# Patient Record
Sex: Female | Born: 1985 | Hispanic: Yes | Marital: Married | Smoking: Never smoker
Health system: Southern US, Community
[De-identification: ages and names within clinical notes are randomized; demographics above are authoritative.]

---

## 2019-07-17 ENCOUNTER — Emergency Department: Payer: Worker's Compensation

## 2019-07-17 ENCOUNTER — Emergency Department
Admission: EM | Admit: 2019-07-17 | Discharge: 2019-07-17 | Disposition: A | Discharge status: Home / Self Care | Payer: Worker's Compensation | Attending: Emergency Medicine | Admitting: Emergency Medicine

## 2019-07-17 ENCOUNTER — Other Ambulatory Visit: Payer: Self-pay

## 2019-07-17 ENCOUNTER — Encounter: Payer: Self-pay | Admitting: Emergency Medicine

## 2019-07-17 DIAGNOSIS — Y990 Civilian activity done for income or pay: Secondary | ICD-10-CM | POA: Insufficient documentation

## 2019-07-17 DIAGNOSIS — S300XXA Contusion of lower back and pelvis, initial encounter: Secondary | ICD-10-CM | POA: Insufficient documentation

## 2019-07-17 DIAGNOSIS — W010XXA Fall on same level from slipping, tripping and stumbling without subsequent striking against object, initial encounter: Secondary | ICD-10-CM | POA: Insufficient documentation

## 2019-07-17 DIAGNOSIS — Y939 Activity, unspecified: Secondary | ICD-10-CM | POA: Insufficient documentation

## 2019-07-17 DIAGNOSIS — Y929 Unspecified place or not applicable: Secondary | ICD-10-CM | POA: Insufficient documentation

## 2019-07-17 DIAGNOSIS — S3992XA Unspecified injury of lower back, initial encounter: Secondary | ICD-10-CM | POA: Diagnosis present

## 2019-07-17 LAB — POC URINE PREG, ED: Preg Test, Ur: NEGATIVE

## 2019-07-17 MED ORDER — TRAMADOL HCL 50 MG PO TABS
50.0000 mg | ORAL_TABLET | Freq: Once | ORAL | Status: AC
  Administered 2019-07-17: 50 mg via ORAL
  Filled 2019-07-17: qty 1

## 2019-07-17 MED ORDER — CYCLOBENZAPRINE HCL 10 MG PO TABS: 10.0000 mg | ORAL_TABLET | Freq: Three times a day (TID) | ORAL | 0 refills | Status: DC | PRN

## 2019-07-17 MED ORDER — TRAMADOL HCL 50 MG PO TABS: 50.0000 mg | ORAL_TABLET | Freq: Four times a day (QID) | ORAL | 0 refills | Status: DC | PRN

## 2019-07-17 NOTE — ED Notes (Signed)
See triage note. Pt c/o of lower back pain radiating to left hip and leg.

## 2019-07-17 NOTE — ED Provider Notes (Signed)
[facility] Emergency Department Provider Note ____________________________________________  Time seen: Approximately 11:59 PM  I have reviewed the triage vital signs and the nursing notes.   HISTORY  Chief Complaint Back Pain and Fall    HPI [place] Karlyn Glasco is a 34 y.o. female who presents to the emergency department for evaluation and treatment of sacral pain after mechanical, nonsyncopal fall at work yesterday.  She slipped in something and landed on her lower back.  She was evaluated at urgent care and given methocarbamol.  She has not had any relief.  No x-rays were taken.  History reviewed. No pertinent past medical history.  There are no problems to display for this patient.   History reviewed. No pertinent surgical history.  Prior to Admission medications   Medication Sig Start Date End Date Taking? Authorizing Provider  cyclobenzaprine (FLEXERIL) 10 MG tablet Take 1 tablet (10 mg total) by mouth 3 (three) times daily as needed. 07/17/19   Willetta York, Rulon Eisenmenger B, FNP  traMADol (ULTRAM) 50 MG tablet Take 1 tablet (50 mg total) by mouth every 6 (six) hours as needed. 07/17/19   Chinita Pester, FNP    Allergies Patient has no allergy information on record.  No family history on file.  Social History Social History   Tobacco Use  . Smoking status: Not on file  Substance Use Topics  . Alcohol use: Not on file  . Drug use: Not on file    Review of Systems Constitutional: Negative for fever. Cardiovascular: Negative for chest pain. Respiratory: Negative for shortness of breath. Musculoskeletal: Positive for lower back pain. Skin: Negative for open wounds or lesions. Neurological: Negative for decrease in sensation  ____________________________________________   PHYSICAL EXAM:  VITAL SIGNS: ED Triage Vitals  Enc Vitals Group     BP 07/17/19 1807 (!) 108/92     Pulse Rate 07/17/19 1807 88     Resp 07/17/19 1807 18     Temp  07/17/19 1807 99.7 F (37.6 C)     Temp Source 07/17/19 1807 Oral     SpO2 07/17/19 1807 99 %     Weight 07/17/19 1801 143 lb (64.9 kg)     Height 07/17/19 1801 5\' 2"  (1.575 m)     Head Circumference --      Peak Flow --      Pain Score 07/17/19 1801 7     Pain Loc --      Pain Edu? --      Excl. in GC? --     Constitutional: Alert and oriented. Well appearing and in no acute distress. Eyes: Conjunctivae are clear without discharge or drainage Head: Atraumatic Neck: Supple.  No focal midline tenderness. Respiratory: No cough. Respirations are even and unlabored. Musculoskeletal: Focal tenderness over the sacrum.  No midline tenderness over the lumbar spine. Neurologic: Motor and sensory function is intact.  No radiculopathy. Skin: Intact Psychiatric: Affect and behavior are appropriate.  ____________________________________________   LABS (all labs ordered are listed, but only abnormal results are displayed)  Labs Reviewed  POC URINE PREG, ED   ____________________________________________  RADIOLOGY  Image of the sacrum and coccyx is negative for acute bony abnormality per radiology. ____________________________________________   PROCEDURES  Procedures  ____________________________________________   INITIAL IMPRESSION / ASSESSMENT AND PLAN / ED COURSE  Zarielle Cea Abbie Berling is a 34 y.o. who presents to the emergency department for evaluation after mechanical, nonsyncopal fall while at work.  See HPI for further details.  X-ray  of the coccyx and sacrum is negative for acute findings.  The patient will be treated with Flexeril and tramadol.  She is to rest over the next couple of days and use ice off and on.  She will be provided a work excuse for the next couple days as well.  Patient instructed to follow-up with orthopedics if not improving over the week.  She was also instructed to return to the emergency department for symptoms that change or worsen if  unable schedule an appointment with orthopedics or primary care.  Medications  traMADol (ULTRAM) tablet 50 mg (50 mg Oral Given 07/17/19 2149)    Pertinent labs & imaging results that were available during my care of the patient were reviewed by me and considered in my medical decision making (see chart for details).  _________________________________________   FINAL CLINICAL IMPRESSION(S) / ED DIAGNOSES  Final diagnoses:  Sacral contusion, initial encounter    ED Discharge Orders         Ordered    cyclobenzaprine (FLEXERIL) 10 MG tablet  3 times daily PRN     07/17/19 2132    traMADol (ULTRAM) 50 MG tablet  Every 6 hours PRN     07/17/19 2132           If controlled substance prescribed during this visit, 12 month history viewed on the [place] prior to issuing an initial prescription for Schedule II or III opiod.   Victorino Dike, FNP 07/18/19 0001    Vanessa [place], MD 07/18/19 1504

## 2019-07-17 NOTE — ED Triage Notes (Signed)
Per interpreter, pt fell at work and hurt her lower back. 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

## 2020-12-08 ENCOUNTER — Emergency Department
Admission: EM | Admit: 2020-12-08 | Discharge: 2020-12-08 | Disposition: A | Discharge status: Home / Self Care | Payer: Self-pay | Attending: Emergency Medicine | Admitting: Emergency Medicine

## 2020-12-08 ENCOUNTER — Emergency Department: Payer: Self-pay

## 2020-12-08 ENCOUNTER — Other Ambulatory Visit: Payer: Self-pay

## 2020-12-08 DIAGNOSIS — D72829 Elevated white blood cell count, unspecified: Secondary | ICD-10-CM | POA: Insufficient documentation

## 2020-12-08 DIAGNOSIS — R112 Nausea with vomiting, unspecified: Secondary | ICD-10-CM | POA: Insufficient documentation

## 2020-12-08 DIAGNOSIS — R1084 Generalized abdominal pain: Secondary | ICD-10-CM | POA: Insufficient documentation

## 2020-12-08 DIAGNOSIS — E860 Dehydration: Secondary | ICD-10-CM | POA: Insufficient documentation

## 2020-12-08 DIAGNOSIS — R197 Diarrhea, unspecified: Secondary | ICD-10-CM | POA: Insufficient documentation

## 2020-12-08 DIAGNOSIS — Z20822 Contact with and (suspected) exposure to covid-19: Secondary | ICD-10-CM | POA: Insufficient documentation

## 2020-12-08 LAB — URINALYSIS, COMPLETE (UACMP) WITH MICROSCOPIC
Bilirubin Urine: NEGATIVE
Glucose, UA: NEGATIVE mg/dL
Hgb urine dipstick: NEGATIVE
Ketones, ur: NEGATIVE mg/dL
Leukocytes,Ua: NEGATIVE
Nitrite: NEGATIVE
Protein, ur: 100 mg/dL — AB
Specific Gravity, Urine: 1.03 (ref 1.005–1.030)
pH: 6 (ref 5.0–8.0)

## 2020-12-08 LAB — COMPREHENSIVE METABOLIC PANEL
ALT: 26 U/L (ref 0–44)
AST: 19 U/L (ref 15–41)
Albumin: 4.4 g/dL (ref 3.5–5.0)
Alkaline Phosphatase: 65 U/L (ref 38–126)
Anion gap: 12 (ref 5–15)
BUN: 19 mg/dL (ref 6–20)
CO2: 21 mmol/L — ABNORMAL LOW (ref 22–32)
Calcium: 9 mg/dL (ref 8.9–10.3)
Chloride: 101 mmol/L (ref 98–111)
Creatinine, Ser: 0.64 mg/dL (ref 0.44–1.00)
GFR, Estimated: >60 mL/min (ref >60)
Glucose, Bld: 106 mg/dL — ABNORMAL HIGH (ref 70–99)
Potassium: 3.3 mmol/L — ABNORMAL LOW (ref 3.5–5.1)
Sodium: 134 mmol/L — ABNORMAL LOW (ref 135–145)
Total Bilirubin: 1.2 mg/dL (ref 0.3–1.2)
Total Protein: 8.4 g/dL — ABNORMAL HIGH (ref 6.5–8.1)

## 2020-12-08 LAB — CBC
HCT: 49.7 % — ABNORMAL HIGH (ref 36.0–46.0)
Hemoglobin: 17.3 g/dL — ABNORMAL HIGH (ref 12.0–15.0)
MCH: 27.9 pg (ref 26.0–34.0)
MCHC: 34.8 g/dL (ref 30.0–36.0)
MCV: 80 fL (ref 80.0–100.0)
Platelets: 246 10*3/uL (ref 150–400)
RBC: 6.21 MIL/uL — ABNORMAL HIGH (ref 3.87–5.11)
RDW: 13.2 % (ref 11.5–15.5)
WBC: 11.6 10*3/uL — ABNORMAL HIGH (ref 4.0–10.5)
nRBC: 0 % (ref 0.0–0.2)

## 2020-12-08 LAB — LIPASE, BLOOD: Lipase: 34 U/L (ref 11–51)

## 2020-12-08 LAB — SARS CORONAVIRUS 2 (TAT 6-24 HRS): SARS Coronavirus 2: NEGATIVE

## 2020-12-08 LAB — POC URINE PREG, ED: Preg Test, Ur: NEGATIVE

## 2020-12-08 MED ORDER — ONDANSETRON HCL 4 MG/2ML IJ SOLN
4.0000 mg | Freq: Once | INTRAMUSCULAR | Status: AC
  Administered 2020-12-08: 4 mg via INTRAVENOUS
  Filled 2020-12-08: qty 2

## 2020-12-08 MED ORDER — KETOROLAC TROMETHAMINE 30 MG/ML IJ SOLN
15.0000 mg | Freq: Once | INTRAMUSCULAR | Status: AC
  Administered 2020-12-08: 15 mg via INTRAVENOUS
  Filled 2020-12-08: qty 1

## 2020-12-08 MED ORDER — ONDANSETRON 4 MG PO TBDP: 4.0000 mg | ORAL_TABLET | Freq: Three times a day (TID) | ORAL | 0 refills | Status: DC | PRN

## 2020-12-08 MED ORDER — IBUPROFEN 600 MG PO TABS: 600.0000 mg | ORAL_TABLET | Freq: Three times a day (TID) | ORAL | 0 refills | Status: DC | PRN

## 2020-12-08 MED ORDER — SODIUM CHLORIDE 0.9 % IV BOLUS
1000.0000 mL | Freq: Once | INTRAVENOUS | Status: AC
  Administered 2020-12-08: 1000 mL via INTRAVENOUS

## 2020-12-08 MED ORDER — MORPHINE SULFATE (PF) 4 MG/ML IV SOLN
4.0000 mg | Freq: Once | INTRAVENOUS | Status: AC
  Administered 2020-12-08: 4 mg via INTRAVENOUS
  Filled 2020-12-08: qty 1

## 2020-12-08 NOTE — ED Notes (Signed)
Pt transported to CT at this time.

## 2020-12-08 NOTE — ED Triage Notes (Signed)
Pt c/o abd pain with N/VD since yesterday. 

## 2020-12-08 NOTE — ED Notes (Signed)
Pt was able to keep down glass of water with no vomiting.

## 2020-12-08 NOTE — ED Notes (Addendum)
MD and interpreter at bedside to discuss results with pt and plan for discharge. MD will order COVID test and pt will be notified via phone if it is positive and he will prescribe her some medication for her abdomen complaints.

## 2020-12-08 NOTE — ED Triage Notes (Signed)
First Nurse Note:  Arrives from [facility] for ED evaluation of abdominal pain and diarrhea.  AAOx3.  MAD.  Skin warm and dry.

## 2020-12-08 NOTE — ED Notes (Signed)
Pt given water for PO challenge 

## 2020-12-08 NOTE — ED Provider Notes (Signed)
[facility] Emergency Department Provider Note  ____________________________________________   Event Date/Time   First MD Initiated Contact with Patient 12/08/20 [phone]     (approximate)  I have reviewed the triage vital signs and the nursing notes.   HISTORY  Chief Complaint Abdominal Pain    HPI [name] is a 35 y.o. female here with abdominal pain.  The patient states that for the last several days, she has had aching, gnawing, generalized abdominal pain with associated nausea and nonbloody, nonbilious emesis.  She has had some loose stool.  She has had intermittent mild abdominal cramping.  She states her symptoms began fairly gradually and persisted but not necessarily worsened.  She denies any fevers.  Denies any known sick contacts.  Denies any suspicious food intake.  She has not had any known COVID exposures.  No recent medication changes.  No recent travel.        History reviewed. No pertinent past medical history.  There are no problems to display for this patient.   History reviewed. No pertinent surgical history.  Prior to Admission medications   Medication Sig Start Date End Date Taking? Authorizing Provider  cyclobenzaprine (FLEXERIL) 10 MG tablet Take 1 tablet (10 mg total) by mouth 3 (three) times daily as needed. 07/17/19   Triplett, Rulon Eisenmenger B, FNP  traMADol (ULTRAM) 50 MG tablet Take 1 tablet (50 mg total) by mouth every 6 (six) hours as needed. 07/17/19   Chinita Pester, FNP    Allergies Patient has no known allergies.  No family history on file.  Social History Social History   Tobacco Use  . Smoking status: Never Smoker  . Smokeless tobacco: Never Used  Substance Use Topics  . Alcohol use: Not Currently  . Drug use: Not Currently    Review of Systems  Review of Systems  Constitutional: Positive for fatigue. Negative for fever.  HENT: Negative for congestion and sore throat.   Eyes: Negative for  visual disturbance.  Respiratory: Negative for cough and shortness of breath.   Cardiovascular: Negative for chest pain.  Gastrointestinal: Positive for abdominal pain, diarrhea, nausea and vomiting.  Genitourinary: Negative for flank pain.  Musculoskeletal: Negative for back pain and neck pain.  Skin: Negative for rash and wound.  All other systems reviewed and are negative.    ____________________________________________  PHYSICAL EXAM:      VITAL SIGNS: ED Triage Vitals  Enc Vitals Group     BP 12/08/20 0856 119/80     Pulse Rate 12/08/20 0856 100     Resp 12/08/20 0856 16     Temp 12/08/20 0856 98.4 F (36.9 C)     Temp Source 12/08/20 0856 Oral     SpO2 12/08/20 0856 98 %     Weight 12/08/20 0859 140 lb (63.5 kg)     Height 12/08/20 0859 5\' 2"  (1.575 m)     Head Circumference --      Peak Flow --      Pain Score 12/08/20 0859 7     Pain Loc --      Pain Edu? --      Excl. in GC? --      Physical Exam Vitals and nursing note reviewed.  Constitutional:      General: She is not in acute distress.    Appearance: She is well-developed.  HENT:     Head: Normocephalic and atraumatic.  Eyes:     Conjunctiva/sclera: Conjunctivae normal.  Cardiovascular:  Rate and Rhythm: Normal rate and regular rhythm.     Heart sounds: Normal heart sounds. No murmur heard. No friction rub.  Pulmonary:     Effort: Pulmonary effort is normal. No respiratory distress.     Breath sounds: Normal breath sounds. No wheezing or rales.  Abdominal:     General: There is no distension.     Palpations: Abdomen is soft.     Tenderness: There is generalized abdominal tenderness (mild, no rebound or guarding).  Musculoskeletal:     Cervical back: Neck supple.  Skin:    General: Skin is warm.     Capillary Refill: Capillary refill takes less than 2 seconds.  Neurological:     Mental Status: She is alert and oriented to person, place, and time.     Motor: No abnormal muscle tone.        ____________________________________________   LABS (all labs ordered are listed, but only abnormal results are displayed)  Labs Reviewed  COMPREHENSIVE METABOLIC PANEL - Abnormal; Notable for the following components:      Result Value   Sodium 134 (*)    Potassium 3.3 (*)    CO2 21 (*)    Glucose, Bld 106 (*)    Total Protein 8.4 (*)    All other components within normal limits  CBC - Abnormal; Notable for the following components:   WBC 11.6 (*)    RBC 6.21 (*)    Hemoglobin 17.3 (*)    HCT 49.7 (*)    All other components within normal limits  URINALYSIS, COMPLETE (UACMP) WITH MICROSCOPIC - Abnormal; Notable for the following components:   Color, Urine YELLOW (*)    APPearance HAZY (*)    Protein, ur 100 (*)    Bacteria, UA RARE (*)    All other components within normal limits  LIPASE, BLOOD  POC URINE PREG, ED    ____________________________________________  EKG:  ________________________________________  RADIOLOGY All imaging, including plain films, CT scans, and ultrasounds, independently reviewed by me, and interpretations confirmed via formal radiology reads.  ED MD interpretation:   CT abdomen/pelvis: Negative, no acute normality  Official radiology report(s): CT ABDOMEN PELVIS WO CONTRAST  Result Date: 12/08/2020 CLINICAL DATA:  35 year old female with history of acute onset of nonlocalized abdominal pain. EXAM: CT ABDOMEN AND PELVIS WITHOUT CONTRAST TECHNIQUE: Multidetector CT imaging of the abdomen and pelvis was performed following the standard protocol without IV contrast. COMPARISON:  No priors. FINDINGS: Lower chest: Unremarkable. Hepatobiliary: Mild diffuse low attenuation throughout the hepatic parenchyma, indicative of hepatic steatosis. No definite cystic or solid hepatic lesions are confidently identified on today's noncontrast CT examination. Unenhanced appearance of the gallbladder is unremarkable. Pancreas: No definite pancreatic mass or  peripancreatic fluid collections or inflammatory changes are noted on today's noncontrast CT examination. Spleen: Unremarkable. Adrenals/Urinary Tract: There are no abnormal calcifications within the collecting system of either kidney, along the course of either ureter, or within the lumen of the urinary bladder. No hydroureteronephrosis or perinephric stranding to suggest urinary tract obstruction at this time. The unenhanced appearance of the kidneys is unremarkable bilaterally. Unenhanced appearance of the urinary bladder is normal. Stomach/Bowel: Unenhanced appearance of the stomach is normal. No pathologic dilatation of small bowel or colon. Normal appendix. Vascular/Lymphatic: No atherosclerotic calcifications in the abdominal aorta or pelvic vasculature. No lymphadenopathy noted in the abdomen or pelvis. Reproductive: Unenhanced appearance of the uterus and ovaries are unremarkable. Other: No significant volume of ascites.  No pneumoperitoneum. Musculoskeletal: There are no  aggressive appearing lytic or blastic lesions noted in the visualized portions of the skeleton. IMPRESSION: 1. No acute findings are noted in the abdomen or pelvis to account for the patient's symptoms. 2. Hepatic steatosis. Electronically Signed   By: Trudie Reed M.D.   On: 12/08/2020 10:32    ____________________________________________  PROCEDURES   Procedure(s) performed (including Critical Care):  Procedures  ____________________________________________  INITIAL IMPRESSION / MDM / ASSESSMENT AND PLAN / ED COURSE  As part of my medical decision making, I reviewed the following data within the electronic MEDICAL RECORD NUMBER Nursing notes reviewed and incorporated, Old chart reviewed, Notes from prior ED visits, and [place] Controlled Substance Database       *Marvetta Vohs Hanaan Gancarz was evaluated in Emergency Department on 12/08/2020 for the symptoms described in the history of present illness. She was evaluated  in the context of the global COVID-19 pandemic, which necessitated consideration that the patient might be at risk for infection with the SARS-CoV-2 virus that causes COVID-19. Institutional protocols and algorithms that pertain to the evaluation of patients at risk for COVID-19 are in a state of rapid change based on information released by regulatory bodies including the CDC and federal and state organizations. These policies and algorithms were followed during the patient's care in the ED.  Some ED evaluations and interventions may be delayed as a result of limited staffing during the pandemic.*     Medical Decision Making: 35 year old female here with generalized abdominal pain, nausea, vomiting, and diarrhea.  Suspect viral GI illness versus foodborne illness.  Patient is overall nontoxic and well-appearing.  Her abdomen is soft and nondistended.  Labs reviewed from urgent care.  Patient has mild leukocytosis and likely hemoconcentration with mild dehydration.  UA without signs of UTI.  UPT negative.  CT abdomen and pelvis obtained, reviewed, shows no evidence of acute abnormality.  No evidence of appendicitis or diverticulitis.  Patient feels better after fluids and antiemetics.  Will discharge with supportive care and outpatient follow-up.  ____________________________________________  FINAL CLINICAL IMPRESSION(S) / ED DIAGNOSES  Final diagnoses:  None     MEDICATIONS GIVEN DURING THIS VISIT:  Medications  sodium chloride 0.9 % bolus 1,000 mL (0 mLs Intravenous Stopped 12/08/20 1134)  ondansetron (ZOFRAN) injection 4 mg (4 mg Intravenous Given 12/08/20 0952)  morphine 4 MG/ML injection 4 mg (4 mg Intravenous Given 12/08/20 1028)  ketorolac (TORADOL) 30 MG/ML injection 15 mg (15 mg Intravenous Given 12/08/20 1028)     ED Discharge Orders    None       Note:  This document was prepared using Dragon voice recognition software and may include unintentional dictation errors.   Shaune Pollack, MD 12/08/20 1147

## 2020-12-08 NOTE — ED Notes (Signed)
MD and interpreter at bedside for assessment of pt.   Pt advised she has been vomiting and urge to vomit with a lot of abdominal pain and back aches. All of this started yesterday. Pt drank a bottle of water and became nauseated and then an hour later started vomiting. Pt has not been able to eat at all. Pt advised she had 2 episodes of diarrhea yesterday. Back pain is very low with no radiation to shoulders. Denies Any exposure to anyone with sickness. Saturday she ate a coconut and became nauseated but it went away. Denies any surgeries. Pt rates pain at 6/10. Pt points to lower abdomen. Pt states she feels "inflamed". Pt does confirm burning when urinating that started yesterday. Denies HX of stomach ulcer. Pt has pain upon palpation of abdomen by MD. Denies vaginal bleeding or discharge. Pt was seen at [facility] next door for possible diverticulitis and dehydration.

## 2020-12-08 NOTE — ED Notes (Signed)
Patient transported to CT 

## 2021-04-01 ENCOUNTER — Emergency Department: Payer: No Typology Code available for payment source

## 2021-04-01 ENCOUNTER — Emergency Department
Admission: EM | Admit: 2021-04-01 | Discharge: 2021-04-01 | Disposition: A | Discharge status: Home / Self Care | Payer: No Typology Code available for payment source | Attending: Emergency Medicine | Admitting: Emergency Medicine

## 2021-04-01 ENCOUNTER — Other Ambulatory Visit: Payer: Self-pay

## 2021-04-01 DIAGNOSIS — R0789 Other chest pain: Secondary | ICD-10-CM | POA: Insufficient documentation

## 2021-04-01 DIAGNOSIS — M549 Dorsalgia, unspecified: Secondary | ICD-10-CM | POA: Diagnosis not present

## 2021-04-01 DIAGNOSIS — M542 Cervicalgia: Secondary | ICD-10-CM | POA: Insufficient documentation

## 2021-04-01 DIAGNOSIS — M7918 Myalgia, other site: Secondary | ICD-10-CM

## 2021-04-01 DIAGNOSIS — Y92410 Unspecified street and highway as the place of occurrence of the external cause: Secondary | ICD-10-CM | POA: Insufficient documentation

## 2021-04-01 MED ORDER — IBUPROFEN 600 MG PO TABS: 600.0000 mg | ORAL_TABLET | Freq: Four times a day (QID) | ORAL | 0 refills | Status: DC | PRN

## 2021-04-01 MED ORDER — HYDROCODONE-ACETAMINOPHEN 5-325 MG PO TABS: 1.0000 | ORAL_TABLET | Freq: Four times a day (QID) | ORAL | 0 refills | Status: AC | PRN

## 2021-04-01 MED ORDER — TIZANIDINE HCL 4 MG PO TABS: 4.0000 mg | ORAL_TABLET | Freq: Three times a day (TID) | ORAL | 0 refills | Status: DC
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
## 2021-08-08 ENCOUNTER — Observation Stay
Admission: EM | Admit: 2021-08-08 | Discharge: 2021-08-09 | Disposition: A | Discharge status: Home / Self Care | Payer: Self-pay | Attending: Surgery | Admitting: Surgery

## 2021-08-08 ENCOUNTER — Emergency Department: Payer: Self-pay

## 2021-08-08 ENCOUNTER — Other Ambulatory Visit: Payer: Self-pay

## 2021-08-08 DIAGNOSIS — K3580 Unspecified acute appendicitis: Principal | ICD-10-CM | POA: Insufficient documentation

## 2021-08-08 DIAGNOSIS — Z20822 Contact with and (suspected) exposure to covid-19: Secondary | ICD-10-CM | POA: Insufficient documentation

## 2021-08-08 DIAGNOSIS — K381 Appendicular concretions: Secondary | ICD-10-CM | POA: Insufficient documentation

## 2021-08-08 LAB — COMPREHENSIVE METABOLIC PANEL
ALT: 34 U/L (ref 0–44)
AST: 19 U/L (ref 15–41)
Albumin: 4.3 g/dL (ref 3.5–5.0)
Alkaline Phosphatase: 75 U/L (ref 38–126)
Anion gap: 8 (ref 5–15)
BUN: 15 mg/dL (ref 6–20)
CO2: 24 mmol/L (ref 22–32)
Calcium: 9.3 mg/dL (ref 8.9–10.3)
Chloride: 99 mmol/L (ref 98–111)
Creatinine, Ser: 0.87 mg/dL (ref 0.44–1.00)
GFR, Estimated: >60 mL/min (ref >60)
Glucose, Bld: 98 mg/dL (ref 70–99)
Potassium: 3.5 mmol/L (ref 3.5–5.1)
Sodium: 131 mmol/L — ABNORMAL LOW (ref 135–145)
Total Bilirubin: 0.8 mg/dL (ref 0.3–1.2)
Total Protein: 8.2 g/dL — ABNORMAL HIGH (ref 6.5–8.1)

## 2021-08-08 LAB — CBC
HCT: 47.1 % — ABNORMAL HIGH (ref 36.0–46.0)
Hemoglobin: 15.9 g/dL — ABNORMAL HIGH (ref 12.0–15.0)
MCH: 27.3 pg (ref 26.0–34.0)
MCHC: 33.8 g/dL (ref 30.0–36.0)
MCV: 80.8 fL (ref 80.0–100.0)
Platelets: 261 10*3/uL (ref 150–400)
RBC: 5.83 MIL/uL — ABNORMAL HIGH (ref 3.87–5.11)
RDW: 12.7 % (ref 11.5–15.5)
WBC: 14.3 10*3/uL — ABNORMAL HIGH (ref 4.0–10.5)
nRBC: 0 % (ref 0.0–0.2)

## 2021-08-08 LAB — RESP PANEL BY RT-PCR (FLU A&B, COVID) ARPGX2
Influenza A by PCR: NEGATIVE
Influenza B by PCR: NEGATIVE
SARS Coronavirus 2 by RT PCR: NEGATIVE

## 2021-08-08 LAB — URINALYSIS, ROUTINE W REFLEX MICROSCOPIC
Bilirubin Urine: NEGATIVE
Glucose, UA: NEGATIVE mg/dL
Hgb urine dipstick: NEGATIVE
Ketones, ur: 15 mg/dL — AB
Leukocytes,Ua: NEGATIVE
Nitrite: NEGATIVE
Protein, ur: 100 mg/dL — AB
Specific Gravity, Urine: 1.02 (ref 1.005–1.030)
pH: 6 (ref 5.0–8.0)

## 2021-08-08 LAB — URINALYSIS, MICROSCOPIC (REFLEX)

## 2021-08-08 LAB — LIPASE, BLOOD: Lipase: 39 U/L (ref 11–51)

## 2021-08-08 LAB — POC URINE PREG, ED: Preg Test, Ur: NEGATIVE

## 2021-08-08 MED ORDER — PIPERACILLIN-TAZOBACTAM 3.375 G IVPB 30 MIN
3.3750 g | Freq: Once | INTRAVENOUS | Status: AC
  Administered 2021-08-08: 3.375 g via INTRAVENOUS
  Filled 2021-08-08 (×2): qty 50

## 2021-08-08 MED ORDER — SODIUM CHLORIDE 0.9 % IV BOLUS
1000.0000 mL | Freq: Once | INTRAVENOUS | Status: AC
  Administered 2021-08-08: 1000 mL via INTRAVENOUS

## 2021-08-08 MED ORDER — ONDANSETRON HCL 4 MG/2ML IJ SOLN
4.0000 mg | Freq: Once | INTRAMUSCULAR | Status: AC
  Administered 2021-08-08: 4 mg via INTRAVENOUS
  Filled 2021-08-08: qty 2

## 2021-08-08 MED ORDER — MORPHINE SULFATE (PF) 4 MG/ML IV SOLN
4.0000 mg | Freq: Once | INTRAVENOUS | Status: AC
  Administered 2021-08-08: 4 mg via INTRAVENOUS
  Filled 2021-08-08: qty 1

## 2021-08-08 MED ORDER — IOHEXOL 300 MG/ML  SOLN
80.0000 mL | Freq: Once | INTRAMUSCULAR | Status: AC | PRN
  Administered 2021-08-08: 80 mL via INTRAVENOUS
  Filled 2021-08-08: qty 80

## 2021-08-08 MED ORDER — ACETAMINOPHEN 500 MG PO TABS
1000.0000 mg | ORAL_TABLET | Freq: Once | ORAL | Status: AC
  Administered 2021-08-08: 1000 mg via ORAL
  Filled 2021-08-08: qty 2

## 2021-08-08 NOTE — ED Provider Notes (Signed)
° °  [facility] Provider Note    Event Date/Time   First MD Initiated Contact with Patient 08/08/21 1456     (approximate)  History   Chief Complaint: Abdominal Pain  HPI  Cynthia Kennedy is a 36 y.o. female with no past medical history who presents to the emergency department for abdominal pain nausea vomiting diarrhea.  According to the patient since yesterday she has been experiencing abdominal pain which she describes as mostly epigastric as well as suprapubic.  Patient states moderate pain, has been nauseated with occasional episodes of vomiting and diarrhea as well.  States subjective fever at home has not measured a temperature, 99.4 in the emergency department.  Physical Exam   Triage Vital Signs: ED Triage Vitals  Enc Vitals Group     BP 08/08/21 1321 (!) 124/96     Pulse Rate 08/08/21 1321 95     Resp 08/08/21 1321 20     Temp 08/08/21 1321 99.4 F (37.4 C)     Temp Source 08/08/21 1321 Oral     SpO2 08/08/21 1321 98 %     Weight 08/08/21 1330 139 lb (63 kg)     Height 08/08/21 1330 5' (1.524 m)     Head Circumference --      Peak Flow --      Pain Score 08/08/21 1330 7     Pain Loc --      Pain Edu? --      Excl. in [place]? --     Most recent vital signs: Vitals:   08/08/21 1321  BP: (!) 124/96  Pulse: 95  Resp: 20  Temp: 99.4 F (37.4 C)  SpO2: 98%    General: Awake, no distress.  CV:  Good peripheral perfusion.  Regular rate and rhythm  Resp:  Normal effort.  Equal breath sounds bilaterally.  Abd:  No distention.  Soft, mild epigastric tenderness, mild suprapubic tenderness.  No rebound guarding or distention.    RADIOLOGY  I personally reviewed the CT images.  No acute significant finding on my evaluation. Radiologist read the CT is likely early acute appendicitis.   MEDICATIONS ORDERED IN ED: Medications - No data to display   IMPRESSION / MDM / [place] / ED COURSE  I reviewed the triage  vital signs and the nursing notes.  Patient presents to the emergency department for abdominal pain nausea vomiting diarrhea.  Patient has a temp in the emergency department 99.4, her lab work shows leukocytosis of 14,000 otherwise reassuring, normal chemistry including normal LFTs, normal lipase negative pregnancy test and reassuring urinalysis.  Given the patient's report of subjective fever at home abdominal pain nausea vomiting diarrhea we will obtain CT imaging the abdomen to rule out intra-abdominal pathology such as appendicitis gallbladder disease colitis or diverticulitis.  Patient very likely could be experiencing gastroenteritis or gastritis as well.  We will place an IV, IV hydrate, treat with Zofran and pain medication and continue to closely monitor while awaiting results.    CT shows likely early acute appendicitis.  Moderate leukocytosis of 14,000 vague abdominal discomfort and CT findings I have discussed patient with general surgery will be down to see the patient.  FINAL CLINICAL IMPRESSION(S) / ED DIAGNOSES   Acute appendicitis    Note:  This document was prepared using Dragon voice recognition software and may include unintentional dictation errors.   Harvest Dark, MD 08/08/21 1753

## 2021-08-08 NOTE — ED Notes (Signed)
Patient was given a gown with instructions to completely undress. Family and husband at bedside.

## 2021-08-08 NOTE — H&P (Signed)
Patient ID: Cynthia Kennedy, female   DOB: Feb 05, 1986, 36 y.o.   MRN: [id number]  Chief Complaint: Abdominal pain  History of Present Illness Cynthia Kennedy is a 36 y.o. female with abdominal pain that began yesterday, area primarily suprapubic.  She became nauseated with nonbilious emesis and loose bowel movements.  She reports a fever without chills, denies melena, hematochezia, dysuria or urinary frequency.  She reports no prior history of similar pain.  Past Medical History No past medical history on file.    No past surgical history on file.  No Known Allergies  Current Facility-Administered Medications  Medication Dose Route Frequency Provider Last Rate Last Admin   piperacillin-tazobactam (ZOSYN) IVPB 3.375 g  3.375 g Intravenous Once Ronny Bacon, MD       Current Outpatient Medications  Medication Sig Dispense Refill   ibuprofen (ADVIL) 600 MG tablet Take 1 tablet (600 mg total) by mouth every 6 (six) hours as needed. 30 tablet 0   tiZANidine (ZANAFLEX) 4 MG tablet Take 1 tablet (4 mg total) by mouth 3 (three) times daily. 30 tablet 0    Family History No family history on file.    Social History Social History   Tobacco Use   Smoking status: Never   Smokeless tobacco: Never  Substance Use Topics   Alcohol use: Not Currently   Drug use: Not Currently        Review of Systems  All other systems reviewed and are negative.    Physical Exam Blood pressure 109/71, pulse 96, temperature 99.4 F (37.4 C), temperature source Oral, resp. rate 20, height 5' (1.524 m), weight 63 kg, SpO2 96 %. Last Weight  Most recent update: 08/08/2021  1:31 PM    Weight  63 kg (139 lb)             CONSTITUTIONAL: Well developed, and nourished, appropriately responsive and aware without distress.  Appears ill. EYES: Sclera non-icteric.   EARS, NOSE, MOUTH AND THROAT: Mask worn.   Hearing is intact to voice.  NECK: Trachea is midline, and  there is no jugular venous distension.  LYMPH NODES:  Lymph nodes in the neck are not enlarged. RESPIRATORY:  Lungs are clear, and breath sounds are equal bilaterally. Normal respiratory effort without pathologic use of accessory muscles. CARDIOVASCULAR: Heart is regular in rate and rhythm. GI: The abdomen is tender mostly over the mid suprapubic area, otherwise soft, nontender, and nondistended. There were no palpable masses. I did not appreciate hepatosplenomegaly. There were normal bowel sounds. MUSCULOSKELETAL:  Symmetrical muscle tone appreciated in all four extremities.    SKIN: Skin turgor is normal. No pathologic skin lesions appreciated.  NEUROLOGIC:  Motor and sensation appear grossly normal.  Cranial nerves are grossly without defect. PSYCH:  Alert and oriented to person, place and time. Affect is appropriate for situation.  Data Reviewed I have personally reviewed what is currently available of the patient's imaging, recent labs and medical records.   Labs:  CBC Latest Ref Rng & Units 08/08/2021 12/08/2020  WBC 4.0 - 10.5 K/uL 14.3(H) 11.6(H)  Hemoglobin 12.0 - 15.0 g/dL 15.9(H) 17.3(H)  Hematocrit 36.0 - 46.0 % 47.1(H) 49.7(H)  Platelets 150 - 400 K/uL 261 246   CMP Latest Ref Rng & Units 08/08/2021 12/08/2020  Glucose 70 - 99 mg/dL 98 106(H)  BUN 6 - 20 mg/dL 15 19  Creatinine 0.44 - 1.00 mg/dL 0.87 0.64  Sodium 135 - 145 mmol/L 131(L) 134(L)  Potassium 3.5 - 5.1  mmol/L 3.5 3.3(L)  Chloride 98 - 111 mmol/L 99 101  CO2 22 - 32 mmol/L 24 21(L)  Calcium 8.9 - 10.3 mg/dL 9.3 9.0  Total Protein 6.5 - 8.1 g/dL 8.2(H) 8.4(H)  Total Bilirubin 0.3 - 1.2 mg/dL 0.8 1.2  Alkaline Phos 38 - 126 U/L 75 65  AST 15 - 41 U/L 19 19  ALT 0 - 44 U/L 34 26      Imaging:  Within last 24 hrs: CT ABDOMEN PELVIS W CONTRAST  Result Date: 08/08/2021 CLINICAL DATA:  Abdominal pain, acute, nonlocalized EXAM: CT ABDOMEN AND PELVIS WITH CONTRAST TECHNIQUE: Multidetector CT imaging of the abdomen  and pelvis was performed using the standard protocol following bolus administration of intravenous contrast. RADIATION DOSE REDUCTION: This exam was performed according to the departmental dose-optimization program which includes automated exposure control, adjustment of the mA and/or kV according to patient size and/or use of iterative reconstruction technique. CONTRAST:  30mL OMNIPAQUE IOHEXOL 300 MG/ML  SOLN COMPARISON:  None. FINDINGS: Lower chest: No acute abnormality. Hepatobiliary: Gallbladder is unremarkable. Focal fatty deposition adjacent to the falciform ligament. No intrahepatic or extrahepatic biliary ductal dilation. Portal vein is patent. Pancreas: Unremarkable. No pancreatic ductal dilatation or surrounding inflammatory changes. Spleen: Normal in size without focal abnormality. Adrenals/Urinary Tract: There is a 20 mm indeterminate RIGHT adrenal nodule. Mild nodularity of the LEFT adrenal gland. Kidneys enhance symmetrically. No hydronephrosis. Subcentimeter hypodense lesions are too small to accurately characterize. No obstructing nephrolithiasis. At least partially duplicated RIGHT renal collecting system. Bladder is unremarkable. Stomach/Bowel: The appendix is distended with feces and air. Mucosa is mildly hyperenhancing with mild adjacent fat stranding. It measures approximately 10 mm. No evidence of bowel obstruction. Vascular/Lymphatic: No significant vascular findings are present. Mildly enlarged mesenteric lymph nodes in the RIGHT lower quadrant with representative lymph node measuring 10 mm (series 2, image 51). Reproductive: Uterus and bilateral adnexa are unremarkable. Other: No free air or free fluid. Musculoskeletal: No acute or significant osseous findings. IMPRESSION: 1. Constellation of findings are favored to reflect early acute appendicitis with reactive mesenteric lymph node enlargement. 2. There is an indeterminate RIGHT adrenal nodule. Recommend nonemergent evaluation with  dedicated adrenal protocol CT or MRI for further characterization. Electronically Signed   By: Valentino Saxon M.D.   On: 08/08/2021 16:17    Assessment    Acute appendicitis There are no problems to display for this patient.   Plan    Robotic appendectomy. The risks, benefits, complications, treatment options, and expected outcomes were discussed with the patient. The treatment of antibiotics alone was discussed giving a 20% chance that this could fail and surgery would be necessary.  Also discussed continuing to the operating room for Laparoscopic Appendectomy.  The possibilities of  bleeding, recurrent infection, perforation of viscus, finding a normal appendix, the need for additional procedures, failure to diagnose a condition, conversion to open procedure and creating a complication requiring transfusion or further operations were discussed. The patient was given the opportunity to ask questions and have them answered.  Patient would like to proceed with Laparoscopic Appendectomy and consent was obtained.  CBC Latest Ref Rng & Units 08/08/2021 12/08/2020  WBC 4.0 - 10.5 K/uL 14.3(H) 11.6(H)  Hemoglobin 12.0 - 15.0 g/dL 15.9(H) 17.3(H)  Hematocrit 36.0 - 46.0 % 47.1(H) 49.7(H)  Platelets 150 - 400 K/uL 261 246      These notes generated with voice recognition software. I apologize for typographical errors.  Ronny Bacon M.D., FACS 08/08/2021, 5:03 PM

## 2021-08-08 NOTE — Anesthesia Preprocedure Evaluation (Addendum)
Anesthesia Evaluation  Patient identified by MRN, date of birth, ID band Patient awake    Reviewed: Allergy & Precautions, NPO status , Patient's Chart, lab work & pertinent test results  Airway Mallampati: III  TM Distance: >3 FB Neck ROM: full    Dental  (+) Caps   Pulmonary neg pulmonary ROS,    Pulmonary exam normal        Cardiovascular negative cardio ROS Normal cardiovascular exam     Neuro/Psych negative neurological ROS  negative psych ROS   GI/Hepatic Neg liver ROS, acute appendicitis   Endo/Other  negative endocrine ROS  Renal/GU      Musculoskeletal   Abdominal Normal abdominal exam  (+)  Abdomen: soft.    Peds  Hematology negative hematology ROS (+)   Anesthesia Other Findings No past medical history on file.  No past surgical history on file.  BMI    Body Mass Index: 27.15 kg/m      Reproductive/Obstetrics negative OB ROS                            Anesthesia Physical Anesthesia Plan  ASA: 1  Anesthesia Plan: General ETT   Post-op Pain Management: Tylenol PO (pre-op) and Ofirmev IV (intra-op)   Induction: Intravenous  PONV Risk Score and Plan: 4 or greater and Ondansetron, Dexamethasone, Midazolam, Treatment may vary due to age or medical condition and Promethazine  Airway Management Planned: Oral ETT  Additional Equipment:   Intra-op Plan:   Post-operative Plan: Extubation in OR  Informed Consent: I have reviewed the patients History and Physical, chart, labs and discussed the procedure including the risks, benefits and alternatives for the proposed anesthesia with the patient or authorized representative who has indicated his/her understanding and acceptance.     Dental Advisory Given  Plan Discussed with: Anesthesiologist, CRNA and Surgeon  Anesthesia Plan Comments:        Anesthesia Quick Evaluation

## 2021-08-08 NOTE — ED Notes (Signed)
Consent is at bedside.

## 2021-08-08 NOTE — ED Notes (Signed)
OR team here to transport pt at this time.

## 2021-08-08 NOTE — ED Notes (Signed)
Report given to OR at this time. Pt is undressed and in a gown. Family is taking her belongings home. Consent is signed with 3 sheets of Pt labels on the bed.

## 2021-08-08 NOTE — ED Triage Notes (Signed)
Pt via POV from home. Pt c/o generalized stomachache, fever, nausea, and diarrhea that started yesterday. Pt states she just felt hot. Denies abd surgeries. Pt is A&Ox4 and NAD

## 2021-08-09 ENCOUNTER — Other Ambulatory Visit: Payer: Self-pay

## 2021-08-09 ENCOUNTER — Emergency Department: Payer: Self-pay | Admitting: Anesthesiology

## 2021-08-09 ENCOUNTER — Encounter
Admission: EM | Disposition: A | Discharge status: Home / Self Care | Payer: Self-pay | Source: Home / Self Care | Attending: Emergency Medicine

## 2021-08-09 ENCOUNTER — Encounter: Payer: Self-pay | Admitting: Surgery

## 2021-08-09 DIAGNOSIS — K3580 Unspecified acute appendicitis: Secondary | ICD-10-CM | POA: Diagnosis present

## 2021-08-09 HISTORY — PX: XI ROBOTIC LAPAROSCOPIC ASSISTED APPENDECTOMY: SHX6877

## 2021-08-09 SURGERY — APPENDECTOMY, ROBOT-ASSISTED, LAPAROSCOPIC
Anesthesia: General

## 2021-08-09 MED ORDER — HYDROMORPHONE HCL 1 MG/ML IJ SOLN: 0.5000 mg | INTRAMUSCULAR | Status: DC | PRN

## 2021-08-09 MED ORDER — ACETAMINOPHEN 10 MG/ML IV SOLN
1000.0000 mg | Freq: Once | INTRAVENOUS | Status: DC | PRN
  Filled 2021-08-09: qty 100

## 2021-08-09 MED ORDER — MIDAZOLAM HCL 2 MG/2ML IJ SOLN
INTRAMUSCULAR | Status: AC
  Filled 2021-08-09: qty 2

## 2021-08-09 MED ORDER — LIDOCAINE HCL (CARDIAC) PF 100 MG/5ML IV SOSY
PREFILLED_SYRINGE | INTRAVENOUS | Status: DC | PRN
  Administered 2021-08-09: 60 mg via INTRAVENOUS

## 2021-08-09 MED ORDER — ONDANSETRON HCL 4 MG/2ML IJ SOLN: 4.0000 mg | Freq: Four times a day (QID) | INTRAMUSCULAR | Status: DC | PRN

## 2021-08-09 MED ORDER — DROPERIDOL 2.5 MG/ML IJ SOLN: 0.6250 mg | Freq: Once | INTRAMUSCULAR | Status: DC | PRN

## 2021-08-09 MED ORDER — PROMETHAZINE HCL 25 MG/ML IJ SOLN: 6.2500 mg | INTRAMUSCULAR | Status: DC | PRN

## 2021-08-09 MED ORDER — PROPOFOL 10 MG/ML IV BOLUS
INTRAVENOUS | Status: DC | PRN
Start: 2021-08-09 — End: 2021-08-09
  Administered 2021-08-09: 150 mg via INTRAVENOUS

## 2021-08-09 MED ORDER — KETOROLAC TROMETHAMINE 30 MG/ML IJ SOLN: 30.0000 mg | Freq: Four times a day (QID) | INTRAMUSCULAR | Status: DC | PRN

## 2021-08-09 MED ORDER — PROPOFOL 10 MG/ML IV BOLUS
INTRAVENOUS | Status: AC
  Filled 2021-08-09: qty 20

## 2021-08-09 MED ORDER — ROCURONIUM BROMIDE 100 MG/10ML IV SOLN
INTRAVENOUS | Status: DC | PRN
  Administered 2021-08-09: 40 mg via INTRAVENOUS

## 2021-08-09 MED ORDER — FENTANYL CITRATE PF 50 MCG/ML IJ SOSY: 25.0000 ug | PREFILLED_SYRINGE | INTRAMUSCULAR | Status: DC | PRN

## 2021-08-09 MED ORDER — OXYCODONE HCL 5 MG/5ML PO SOLN: 5.0000 mg | Freq: Once | ORAL | Status: DC | PRN

## 2021-08-09 MED ORDER — HYDROCODONE-ACETAMINOPHEN 5-325 MG PO TABS: 1.0000 | ORAL_TABLET | Freq: Four times a day (QID) | ORAL | 0 refills | Status: AC | PRN

## 2021-08-09 MED ORDER — ZOLPIDEM TARTRATE 5 MG PO TABS: 5.0000 mg | ORAL_TABLET | Freq: Every evening | ORAL | Status: DC | PRN

## 2021-08-09 MED ORDER — LACTATED RINGERS IV SOLN: INTRAVENOUS | Status: DC | PRN

## 2021-08-09 MED ORDER — FENTANYL CITRATE (PF) 100 MCG/2ML IJ SOLN
INTRAMUSCULAR | Status: AC
  Filled 2021-08-09: qty 2

## 2021-08-09 MED ORDER — OXYCODONE HCL 5 MG PO TABS: 5.0000 mg | ORAL_TABLET | Freq: Once | ORAL | Status: DC | PRN

## 2021-08-09 MED ORDER — SUGAMMADEX SODIUM 500 MG/5ML IV SOLN
INTRAVENOUS | Status: DC | PRN
  Administered 2021-08-09: 150 mg via INTRAVENOUS

## 2021-08-09 MED ORDER — HYDROCODONE-ACETAMINOPHEN 5-325 MG PO TABS
1.0000 | ORAL_TABLET | ORAL | Status: DC | PRN
  Administered 2021-08-09: 2 via ORAL
  Filled 2021-08-09: qty 2

## 2021-08-09 MED ORDER — BUPIVACAINE-EPINEPHRINE (PF) 0.25% -1:200000 IJ SOLN
INTRAMUSCULAR | Status: DC | PRN
  Administered 2021-08-09: 11 mL via PERINEURAL

## 2021-08-09 MED ORDER — MIDAZOLAM HCL 2 MG/2ML IJ SOLN
INTRAMUSCULAR | Status: DC | PRN
Start: 2021-08-09 — End: 2021-08-09
  Administered 2021-08-09: 2 mg via INTRAVENOUS

## 2021-08-09 MED ORDER — FENTANYL CITRATE (PF) 100 MCG/2ML IJ SOLN
25.0000 ug | INTRAMUSCULAR | Status: DC | PRN
  Administered 2021-08-09: 25 ug via INTRAVENOUS

## 2021-08-09 MED ORDER — KETOROLAC TROMETHAMINE 30 MG/ML IJ SOLN
30.0000 mg | Freq: Four times a day (QID) | INTRAMUSCULAR | Status: AC
  Administered 2021-08-09: 30 mg via INTRAVENOUS
  Filled 2021-08-09: qty 1

## 2021-08-09 MED ORDER — ONDANSETRON HCL 4 MG/2ML IJ SOLN
INTRAMUSCULAR | Status: DC | PRN
  Administered 2021-08-09: 4 mg via INTRAVENOUS

## 2021-08-09 MED ORDER — FENTANYL CITRATE (PF) 100 MCG/2ML IJ SOLN
INTRAMUSCULAR | Status: DC | PRN
  Administered 2021-08-09 (×2): 50 ug via INTRAVENOUS

## 2021-08-09 MED ORDER — KETOROLAC TROMETHAMINE 30 MG/ML IJ SOLN
INTRAMUSCULAR | Status: DC | PRN
  Administered 2021-08-09: 30 mg via INTRAVENOUS

## 2021-08-09 MED ORDER — SODIUM CHLORIDE 0.9 % IV SOLN: INTRAVENOUS | Status: DC

## 2021-08-09 MED ORDER — ONDANSETRON 4 MG PO TBDP: 4.0000 mg | ORAL_TABLET | Freq: Four times a day (QID) | ORAL | Status: DC | PRN

## 2021-08-09 MED ORDER — ENOXAPARIN SODIUM 40 MG/0.4ML IJ SOSY: 40.0000 mg | PREFILLED_SYRINGE | INTRAMUSCULAR | Status: DC

## 2021-08-09 MED ORDER — IBUPROFEN 600 MG PO TABS: 600.0000 mg | ORAL_TABLET | Freq: Four times a day (QID) | ORAL | 0 refills | Status: AC | PRN

## 2021-08-09 MED ORDER — DEXAMETHASONE SODIUM PHOSPHATE 10 MG/ML IJ SOLN
INTRAMUSCULAR | Status: DC | PRN
  Administered 2021-08-09: 10 mg via INTRAVENOUS

## 2021-08-09 SURGICAL SUPPLY — 56 items
BLADE CLIPPER SURG (BLADE) IMPLANT
CANNULA CAP OBTURATR AIRSEAL 8 (CAP) ×2 IMPLANT
CLIP VESOLOCK MED LG 6/CT (CLIP) IMPLANT
COVER TIP SHEARS 8 DVNC (MISCELLANEOUS) ×1 IMPLANT
COVER TIP SHEARS 8MM DA VINCI (MISCELLANEOUS) ×1
DECANTER SPIKE VIAL GLASS SM (MISCELLANEOUS) ×2 IMPLANT
DERMABOND ADVANCED (GAUZE/BANDAGES/DRESSINGS) ×1
DERMABOND ADVANCED .7 DNX12 (GAUZE/BANDAGES/DRESSINGS) ×1 IMPLANT
DRAPE ARM DVNC X/XI (DISPOSABLE) ×3 IMPLANT
DRAPE COLUMN DVNC XI (DISPOSABLE) ×1 IMPLANT
DRAPE DA VINCI XI ARM (DISPOSABLE) ×3
DRAPE DA VINCI XI COLUMN (DISPOSABLE) ×1
GLOVE SURG ORTHO LTX SZ7.5 (GLOVE) ×4 IMPLANT
GOWN STRL REUS W/ TWL LRG LVL3 (GOWN DISPOSABLE) ×4 IMPLANT
GOWN STRL REUS W/TWL LRG LVL3 (GOWN DISPOSABLE) ×4
GRASPER SUT TROCAR 14GX15 (MISCELLANEOUS) IMPLANT
INFUSOR MANOMETER BAG 3000ML (MISCELLANEOUS) IMPLANT
IRRIGATION STRYKERFLOW (MISCELLANEOUS) IMPLANT
IRRIGATOR STRYKERFLOW (MISCELLANEOUS)
IRRIGATOR SUCT 8 DISP DVNC XI (IRRIGATION / IRRIGATOR) IMPLANT
IRRIGATOR SUCTION 8MM XI DISP (IRRIGATION / IRRIGATOR)
IV NS IRRIG 3000ML ARTHROMATIC (IV SOLUTION) IMPLANT
KIT PINK PAD W/HEAD ARE REST (MISCELLANEOUS) ×2 IMPLANT
KIT PINK PAD W/HEAD ARM REST (MISCELLANEOUS) ×1 IMPLANT
KIT TURNOVER KIT A (KITS) ×2 IMPLANT
LABEL OR SOLS (LABEL) ×2 IMPLANT
MANIFOLD NEPTUNE II (INSTRUMENTS) ×2 IMPLANT
NDL INSUFFLATION 14GA 120MM (NEEDLE) IMPLANT
NEEDLE HYPO 22GX1.5 SAFETY (NEEDLE) ×2 IMPLANT
NEEDLE INSUFFLATION 14GA 120MM (NEEDLE) IMPLANT
NS IRRIG 500ML POUR BTL (IV SOLUTION) ×2 IMPLANT
PACK LAP CHOLECYSTECTOMY (MISCELLANEOUS) ×2 IMPLANT
POUCH SPECIMEN RETRIEVAL 10MM (ENDOMECHANICALS) ×2 IMPLANT
RELOAD STAPLE 45 3.5 BLU DVNC (STAPLE) IMPLANT
RELOAD STAPLE 60 3.5 BLU DVNC (STAPLE) IMPLANT
RELOAD STAPLER 3.5X45 BLU DVNC (STAPLE) IMPLANT
RELOAD STAPLER 3.5X60 BLU DVNC (STAPLE) IMPLANT
SEAL CANN UNIV 5-8 DVNC XI (MISCELLANEOUS) ×2 IMPLANT
SEAL XI 5MM-8MM UNIVERSAL (MISCELLANEOUS) ×2
SET TUBE FILTERED XL AIRSEAL (SET/KITS/TRAYS/PACK) ×2 IMPLANT
STAPLER 45 DA VINCI SURE FORM (STAPLE)
STAPLER 45 SUREFORM DVNC (STAPLE) IMPLANT
STAPLER 60 DA VINCI SURE FORM (STAPLE)
STAPLER 60 SUREFORM DVNC (STAPLE) IMPLANT
STAPLER RELOAD 3.5X45 BLU DVNC (STAPLE)
STAPLER RELOAD 3.5X45 BLUE (STAPLE)
STAPLER RELOAD 3.5X60 BLU DVNC (STAPLE)
STAPLER RELOAD 3.5X60 BLUE (STAPLE)
SUT MNCRL 4-0 (SUTURE) ×1
SUT MNCRL 4-0 27XMFL (SUTURE) ×1
SUT VIC AB 0 SH 27 (SUTURE) ×2 IMPLANT
SUT VICRYL 0 AB UR-6 (SUTURE) ×2 IMPLANT
SUTURE MNCRL 4-0 27XMF (SUTURE) ×1 IMPLANT
TRAY FOLEY SLVR 16FR LF STAT (SET/KITS/TRAYS/PACK) ×2 IMPLANT
TROCAR Z-THREAD FIOS 11X100 BL (TROCAR) ×2 IMPLANT
WATER STERILE IRR 500ML POUR (IV SOLUTION) ×2 IMPLANT

## 2021-08-09 NOTE — Op Note (Signed)
Robotic appendectomy  Pre-operative Diagnosis: Acute appendicitis  Post-operative Diagnosis: same.    Surgeon: Campbell Lerner, M.D., [facility]  Anesthesia: General endotracheal  Findings: As expected per CT report dilated, extended length appendix.  Very little acute inflammatory changes, though markedly larger than normal.  Estimated Blood Loss: 15 mL         Specimens: Appendix          Complications: none              Procedure Details  The patient was seen again in the Holding Room. The benefits, complications, treatment options, and expected outcomes were discussed with the patient. The risks of bleeding, infection, recurrence of symptoms, failure to resolve symptoms, unanticipated injury, prosthetic placement, prosthetic infection, any of which could require further surgery were reviewed with the patient. The likelihood of improving the patient's symptoms with return to their baseline status is good.  The patient and/or family concurred with the proposed plan, giving informed consent.  The patient was taken to Operating Room, identified and the procedure verified.    Prior to the induction of general anesthesia, antibiotic prophylaxis was administered. VTE prophylaxis was in place.  General anesthesia was then administered and tolerated well. After the induction, the patient was positioned in the supine position and the abdomen was prepped with  Chloraprep and draped in the sterile fashion.  A Time Out was held and the above information confirmed.   After local infiltration of quarter percent Marcaine with epinephrine, stab incision was made left upper quadrant.  Just below the costal margin approximately midclavicular line the Veress needle is passed with sensation of the layers to penetrate the abdominal wall and into the peritoneum.  Saline drop test is confirmed peritoneal placement.  Insufflation is initiated with carbon dioxide to pressures of 15 mmHg.  With local anesthetic  infiltration, a left lower quadrant incision is made, and an optical 11 mm trocar is passed into the peritoneal cavity under direct visualization.  An additional 8.5 mm robotic trochars placed in the suprapubic area and in the left abdominal wall under direct visualization. Using a force bipolar grasper and tip up grasper proceeded with dissecting out the soft tissues adjacent to the cecum and appendix to fully identify the appendix, and mobilize it. The mesoappendix was carefully divided utilizing bipolar cautery, monopolar cautery and scissors. With the appendiceal cecal junction fully isolated, the appendiceal base was crimped, a double ligature of 2-0 Vicryl was utilized to ligate the base of the appendix, and the appendix was divided with monopolar scissors, the appendiceal mucosa was then fulgurated with the same. I utilized a 2-0 Vicryl to dunk the appendiceal stump into the cecal serosa, burying the stump with Lembert sutures of 2-0 Vicryl. We then undocked the robot and proceeded with completing the procedure laparoscopically.  We then placed the appendix in a retrieval bag and withdrew it out the largest port site.  The abdomen was then desufflated and the trochars removed. Incisions were then irrigated and closed with subcuticulars of 4-0 Monocryl.  Skin sealed with Dermabond.  Patient tolerated procedure well.    Campbell Lerner M.D., [facility] 08/09/2021 2:40 AM

## 2021-08-09 NOTE — Anesthesia Postprocedure Evaluation (Signed)
Anesthesia Post Note  Patient: Cynthia Kennedy  Procedure(s) Performed: XI ROBOTIC LAPAROSCOPIC ASSISTED APPENDECTOMY  Patient location during evaluation: PACU Anesthesia Type: General Level of consciousness: awake and alert Pain management: pain level controlled Vital Signs Assessment: post-procedure vital signs reviewed and stable Respiratory status: spontaneous breathing, nonlabored ventilation and respiratory function stable Cardiovascular status: blood pressure returned to baseline and stable Postop Assessment: no apparent nausea or vomiting Anesthetic complications: no   No notable events documented.   Last Vitals:  Vitals:   08/09/21 0612 08/09/21 0805  BP: 100/63 104/64  Pulse: 73 72  Resp: 18 18  Temp: 36.8 C 36.8 C  SpO2: 96% 96%    Last Pain:  Vitals:   08/09/21 1000  TempSrc:   PainSc: 2                  [name]

## 2021-08-09 NOTE — Discharge Summary (Signed)
[facility] SURGICAL DISCHARGE SUMMARY  Patient ID: Cynthia Kennedy MRN: [id number] DOB/AGE: 1985-10-11 36 y.o.  Admit date: 08/08/2021 Discharge date: 08/09/2021  Discharge Diagnoses Patient Active Problem List   Diagnosis Date Noted   Acute appendicitis 08/09/2021    Consultants None  Procedures 08/09/2021:  Laparoscopic Appendectomy   HPI: Cynthia Kennedy is a 36 y.o. female with abdominal pain that began yesterday, area primarily suprapubic.  She became nauseated with nonbilious emesis and loose bowel movements.  She reports a fever without chills, denies melena, hematochezia, dysuria or urinary frequency.  She reports no prior history of similar pain. Work up concerning for acute appendicitis.   Hospital Course: Informed consent was obtained and documented, and patient underwent uneventful laparoscopic appendectomy (Dr Christian Mate, 08/09/2021).  Post-operatively, patient's pain/symptoms improved/resolved and advancement of patient's diet and ambulation were well-tolerated. The remainder of patient's hospital course was essentially unremarkable, and discharge planning was initiated accordingly with patient safely able to be discharged home with appropriate discharge instructions, pain control, and outpatient follow-up after all of her questions were answered to her expressed satisfaction.   Discharge Condition: Good   Physical Examination:  Constitutional: Well appearing female, NAD Pulmonary: Normal effort, no respiratory distress Gastrointestinal: Soft, incisional soreness, non-distended, no rebound/guarding  Skin: Laparoscopic incisions are CDI with dermabond, no erythema or drainage    Allergies as of 08/09/2021   No Known Allergies      Medication List     TAKE these medications    acetaminophen 500 MG tablet Commonly known as: TYLENOL Take 500-1,000 mg by mouth every 6 (six) hours as needed for mild pain or moderate  pain.   HYDROcodone-acetaminophen 5-325 MG tablet Commonly known as: NORCO/VICODIN Take 1 tablet by mouth every 6 (six) hours as needed for severe pain.   ibuprofen 600 MG tablet Commonly known as: ADVIL Take 1 tablet (600 mg total) by mouth every 6 (six) hours as needed.          Follow-up Information     Ronny Bacon, MD. Schedule an appointment as soon as possible for a visit in 2 week(s).   Specialty: General Surgery Why: s/p lap appy Contact information: [address] [place] [address] [phone]                  Time spent on discharge management including discussion of hospital course, clinical condition, outpatient instructions, prescriptions, and follow up with the patient and members of the medical team: >30 minutes  -- Edison Simon , PA-C [facility]  08/09/2021, 8:46 AM [phone] M-F: 7am - 4pm

## 2021-08-09 NOTE — Transfer of Care (Signed)
Immediate Anesthesia Transfer of Care Note  Patient: Cynthia Kennedy  Procedure(s) Performed: XI ROBOTIC LAPAROSCOPIC ASSISTED APPENDECTOMY  Patient Location: PACU  Anesthesia Type:General  Level of Consciousness: drowsy and patient cooperative  Airway & Oxygen Therapy: Patient Spontanous Breathing and Patient connected to nasal cannula oxygen  Post-op Assessment: Report given to RN and Post -op Vital signs reviewed and stable  Post vital signs: Reviewed and stable  Last Vitals:  Vitals Value Taken Time  BP 112/63 08/09/21 0241  Temp 36.1 C 08/09/21 0241  Pulse 82 08/09/21 0243  Resp 16 08/09/21 0243  SpO2 96 % 08/09/21 0243  Vitals shown include unvalidated device data.  Last Pain:  Vitals:   08/09/21 0241  TempSrc:   PainSc: Asleep         Complications: No notable events documented.

## 2021-08-09 NOTE — Progress Notes (Signed)
Patient discharged via interpreter, Leonardo # [phone]. Instructions and follow-up appointment given.  All questions answered.  Patient will be transported via auxiliary once transportation arrives.

## 2021-08-09 NOTE — Discharge Instructions (Signed)
In addition to included general post-operative instructions,  Diet: Resume home diet.   Activity: No heavy lifting >20 pounds (children, pets, laundry, garbage) or strenuous activity for 4 weeks, but light activity and walking are encouraged. Do not drive or drink alcohol if taking narcotic pain medications or having pain that might distract from driving.  Wound care: 2 days after surgery (02/01), you may shower/get incision wet with soapy water and pat dry (do not rub incisions), but no baths or submerging incision underwater until follow-up.   Medications: Resume all home medications. For mild to moderate pain: acetaminophen (Tylenol) or ibuprofen/naproxen (if no kidney disease). Combining Tylenol with alcohol can substantially increase your risk of causing liver disease. Narcotic pain medications, if prescribed, can be used for severe pain, though may cause nausea, constipation, and drowsiness. Do not combine Tylenol and Percocet (or similar) within a 6 hour period as Percocet (and similar) contain(s) Tylenol. If you do not need the narcotic pain medication, you do not need to fill the prescription.  Call office [phone] / [phone]) at any time if any questions, worsening pain, fevers/chills, bleeding, drainage from incision site, or other concerns.

## 2021-08-09 NOTE — Progress Notes (Signed)
Discharge instructions given via interpreter Leonardo # [phone]

## 2021-08-09 NOTE — Anesthesia Procedure Notes (Signed)
Procedure Name: Intubation Date/Time: 08/09/2021 1:33 AM Performed by: Omer Jack, CRNA Pre-anesthesia Checklist: Patient identified, Patient being monitored, Timeout performed, Emergency Drugs available and Suction available Patient Re-evaluated:Patient Re-evaluated prior to induction Oxygen Delivery Method: Circle system utilized Preoxygenation: Pre-oxygenation with 100% oxygen Induction Type: IV induction Ventilation: Mask ventilation without difficulty Laryngoscope Size: Miller and 2 Grade View: Grade I Tube type: Oral Tube size: 6.5 mm Number of attempts: 1 Placement Confirmation: ETT inserted through vocal cords under direct vision, positive ETCO2 and breath sounds checked- equal and bilateral Secured at: 21 cm Tube secured with: Tape Dental Injury: Teeth and Oropharynx as per pre-operative assessment

## 2021-08-10 LAB — SURGICAL PATHOLOGY

## 2021-08-24 ENCOUNTER — Other Ambulatory Visit: Payer: Self-pay

## 2021-08-24 ENCOUNTER — Encounter: Payer: Self-pay | Admitting: Surgery

## 2021-08-24 ENCOUNTER — Ambulatory Visit (INDEPENDENT_AMBULATORY_CARE_PROVIDER_SITE_OTHER): Payer: Self-pay | Admitting: Surgery

## 2021-08-24 VITALS — BP 112/75 | HR 70 | Temp 98.2°F | Ht 60.0 in | Wt 138.4 lb

## 2021-08-24 DIAGNOSIS — Z9049 Acquired absence of other specified parts of digestive tract: Secondary | ICD-10-CM | POA: Insufficient documentation

## 2021-08-24 DIAGNOSIS — Z09 Encounter for follow-up examination after completed treatment for conditions other than malignant neoplasm: Secondary | ICD-10-CM

## 2021-08-24 NOTE — Patient Instructions (Addendum)

## 2021-08-24 NOTE — Progress Notes (Signed)
[facility] POST-OP OFFICE VISIT  08/24/2021  HPI: Clayton Schiffman is a 36 y.o. female 15 days s/p robotic appendectomy.  She denies pain, nausea, vomiting, fevers and chills.  She reports normal bowel activity.  She has not taken any pain medication currently.  She reports having some chest pain for the first 2 days postop that resolved spontaneously.  I suspect this was due to her pneumoperitoneum.  Vital signs: BP 112/75    Pulse 70    Temp 98.2 F (36.8 C) (Oral)    Ht 5' (1.524 m)    Wt 138 lb 6.4 oz (62.8 kg)    SpO2 98%    BMI 27.03 kg/m    Physical Exam: Constitutional: She appears well, moves readily and freely. Abdomen: Soft and nontender, nondistended. Skin: Incisions are clean, dry and intact.  Assessment/Plan: This is a 36 y.o. female 15 days s/p robotic appendectomy.  Doing well.  Patient Active Problem List   Diagnosis Date Noted   Acute appendicitis 08/09/2021    -She may follow-up as needed.  We remain readily available should any new concerns come up.   Ronny Bacon M.D., FACS 08/24/2021, 9:01 AM

## 2021-09-06 ENCOUNTER — Encounter (HOSPITAL_COMMUNITY): Payer: Self-pay | Admitting: Radiology

## 2022-02-14 IMAGING — CT CT ABD-PELV W/O CM
2 of 4 series · 16 of 46 positions shown, 18 images · non-contrast
Comparison: No priors.

CLINICAL DATA: 34-year-old female with history of acute onset of
nonlocalized abdominal pain.

EXAM:
CT ABDOMEN AND PELVIS WITHOUT CONTRAST
TECHNIQUE: Multidetector CT imaging of the abdomen and pelvis was performed
following the standard protocol without IV contrast.

[Series 2: axial st · axial · 0.69mm/px · z∈[-1076,-651]mm · 13 of 93 slices shown, 15 images]
[im 4/93  soft-tissue]
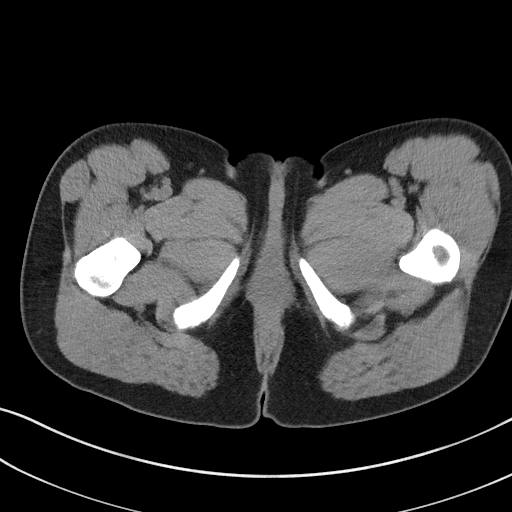
[im 4/93  bone]
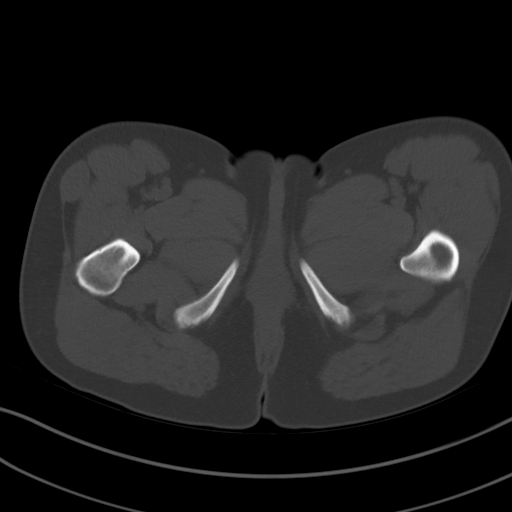
[im 12/93  soft-tissue]
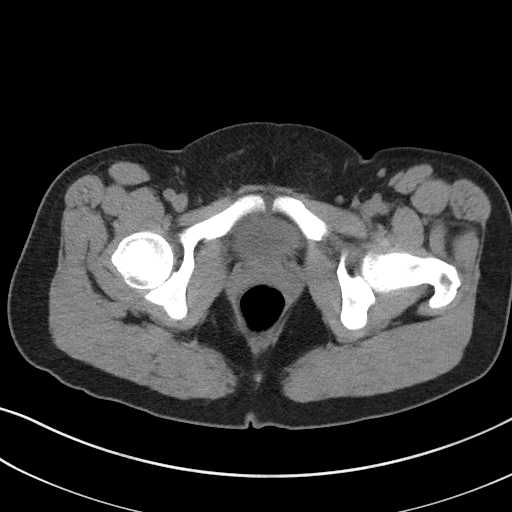
[im 20/93  soft-tissue]
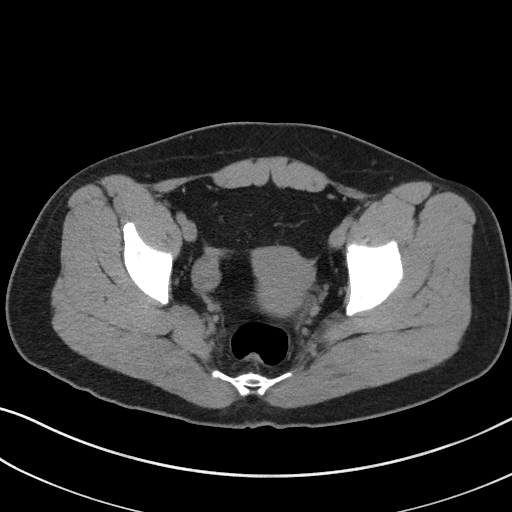
[im 27/93  soft-tissue]
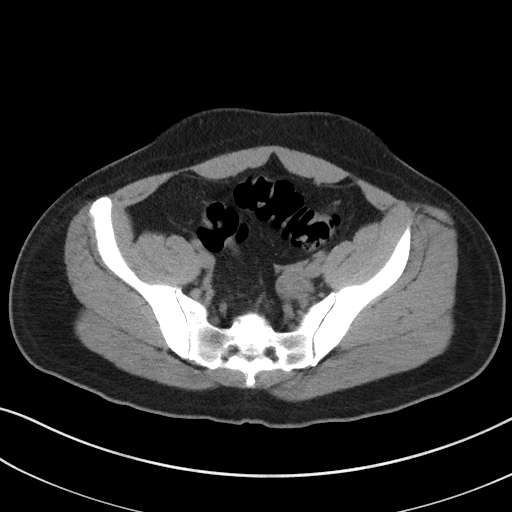
[im 31/93  soft-tissue]
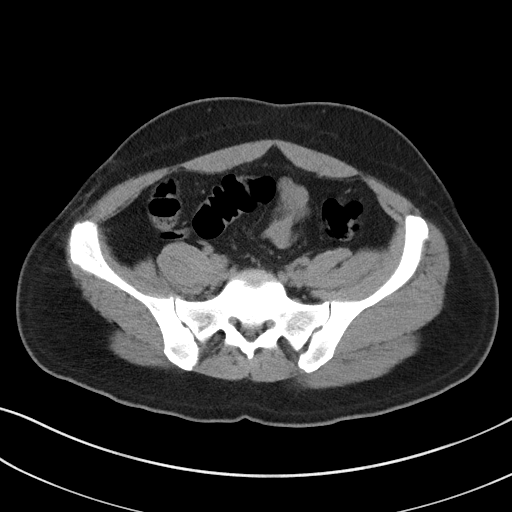
[im 39/93  soft-tissue]
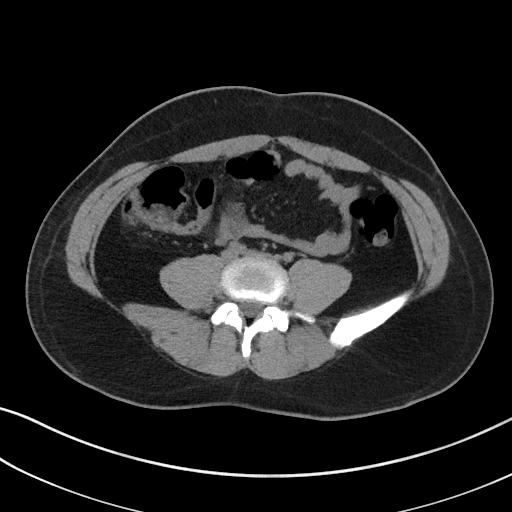
[im 47/93  soft-tissue]
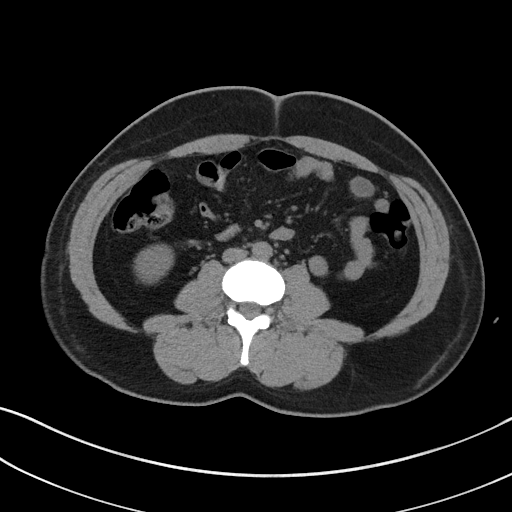
[im 54/93  soft-tissue]
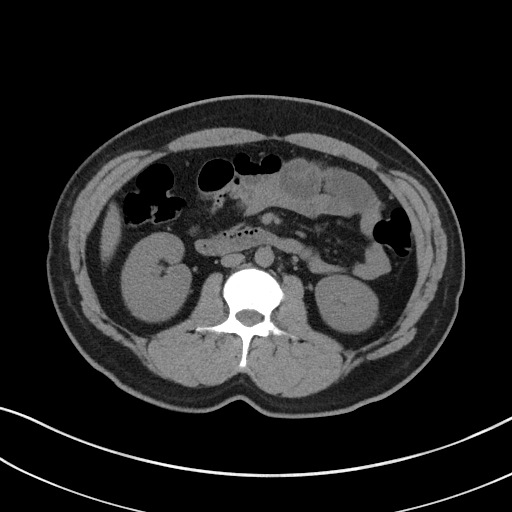
[im 62/93  soft-tissue]
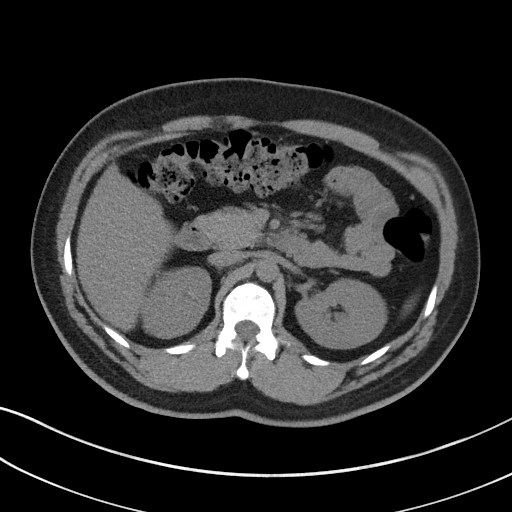
[im 62/93  bone]
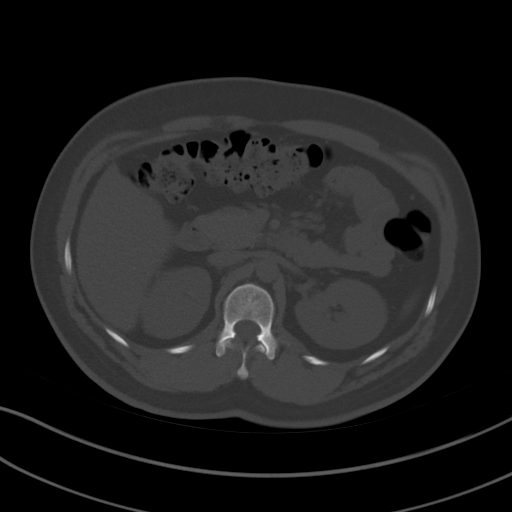
[im 66/93  soft-tissue]
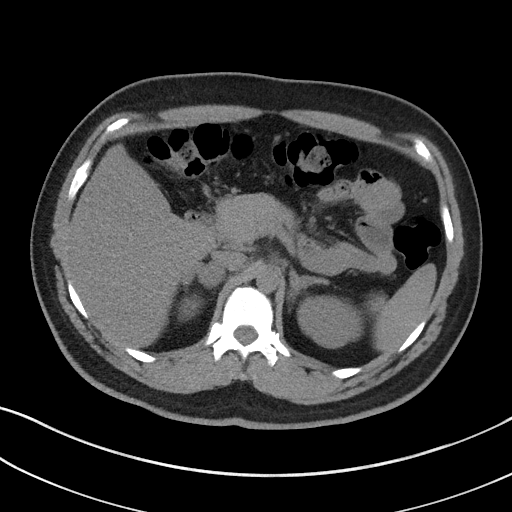
[im 73/93  soft-tissue]
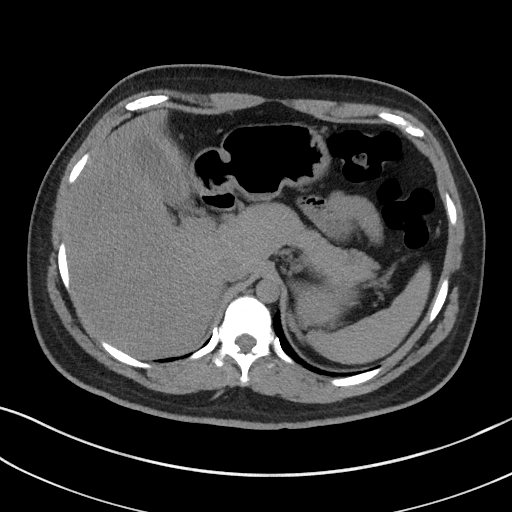
[im 81/93  soft-tissue]
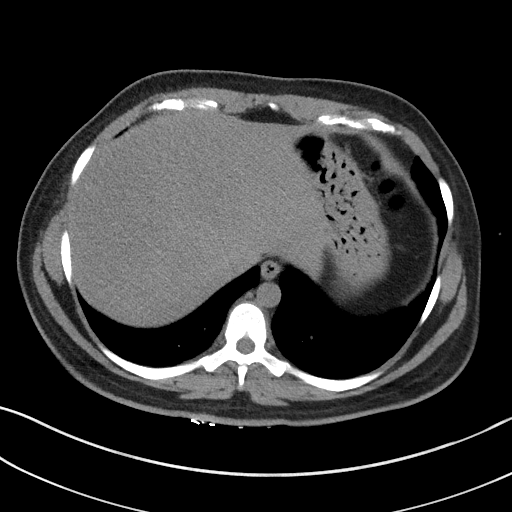
[im 89/93  soft-tissue]
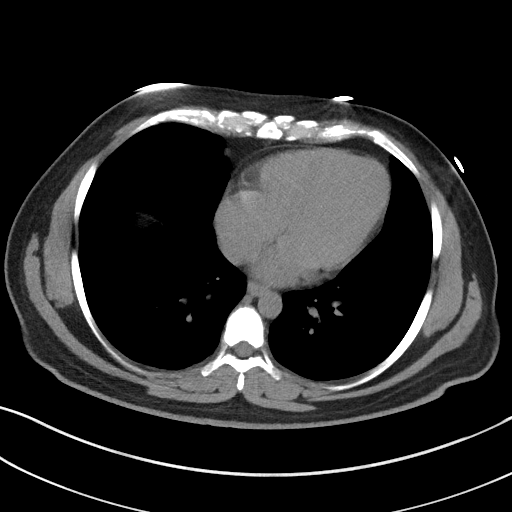

[Series 5: coronal st · coronal · 0.72mm/px · 3 of 80 slices shown]
[im 27/80  soft-tissue]
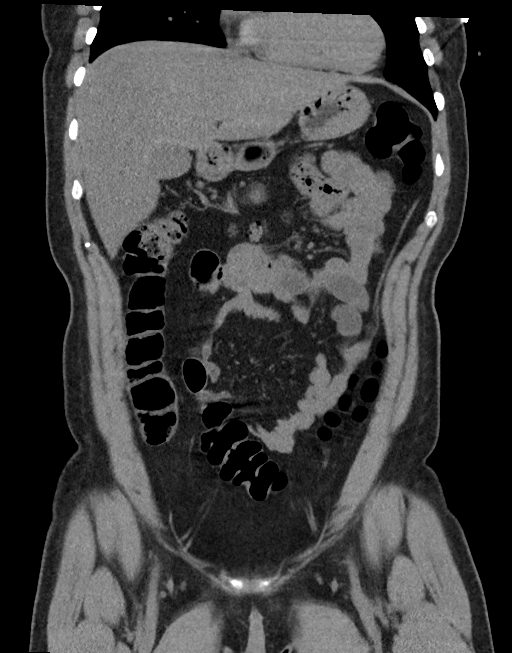
[im 36/80  soft-tissue]
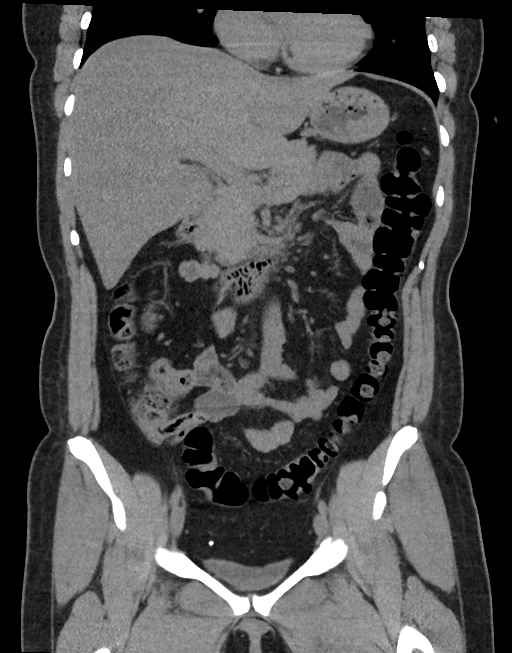
[im 44/80  soft-tissue]
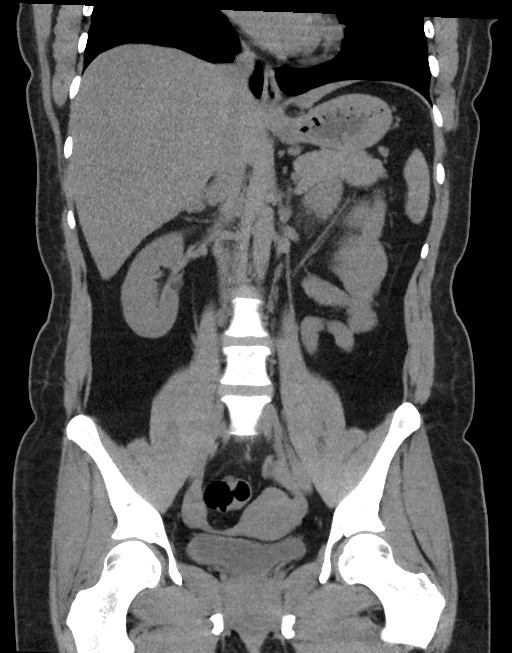

[16 of 46 positions shown; findings below may reference images not displayed]

FINDINGS: Lower chest: Unremarkable.

Hepatobiliary: Mild diffuse low attenuation throughout the hepatic
parenchyma, indicative of hepatic steatosis. No definite cystic or
solid hepatic lesions are confidently identified on today's
noncontrast CT examination. Unenhanced appearance of the gallbladder
is unremarkable.

Pancreas: No definite pancreatic mass or peripancreatic fluid
collections or inflammatory changes are noted on today's noncontrast
CT examination.

Spleen: Unremarkable.

Adrenals/Urinary Tract: There are no abnormal calcifications within
the collecting system of either kidney, along the course of either
ureter, or within the lumen of the urinary bladder. No
hydroureteronephrosis or perinephric stranding to suggest urinary
tract obstruction at this time. The unenhanced appearance of the
kidneys is unremarkable bilaterally. Unenhanced appearance of the
urinary bladder is normal.

Stomach/Bowel: Unenhanced appearance of the stomach is normal. No
pathologic dilatation of small bowel or colon. Normal appendix.

Vascular/Lymphatic: No atherosclerotic calcifications in the
abdominal aorta or pelvic vasculature. No lymphadenopathy noted in
the abdomen or pelvis.

Reproductive: Unenhanced appearance of the uterus and ovaries are
unremarkable.

Other: No significant volume of ascites.  No pneumoperitoneum.

Musculoskeletal: There are no aggressive appearing lytic or blastic
lesions noted in the visualized portions of the skeleton.
IMPRESSION: 1. No acute findings are noted in the abdomen or pelvis to account
for the patient's symptoms.
2. Hepatic steatosis.
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
## 2022-10-15 IMAGING — CT CT ABD-PELV W/ CM
2 of 4 series · 15 of 46 positions shown, 17 images · IV contrast (agent unspecified)
Comparison: None.

CLINICAL DATA: Abdominal pain, acute, nonlocalized

EXAM:
CT ABDOMEN AND PELVIS WITH CONTRAST
TECHNIQUE: Multidetector CT imaging of the abdomen and pelvis was performed
using the standard protocol following bolus administration of
intravenous contrast.

[Series 2: axial st · axial · 0.72mm/px · z∈[-881,-441]mm · 12 of 96 slices shown, 14 images]
[im 4/96  soft-tissue]
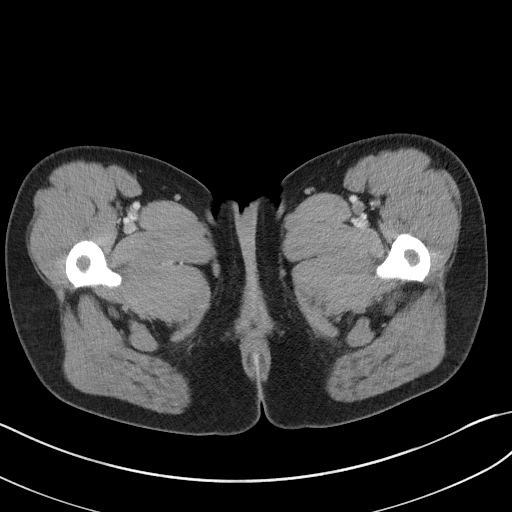
[im 4/96  bone]
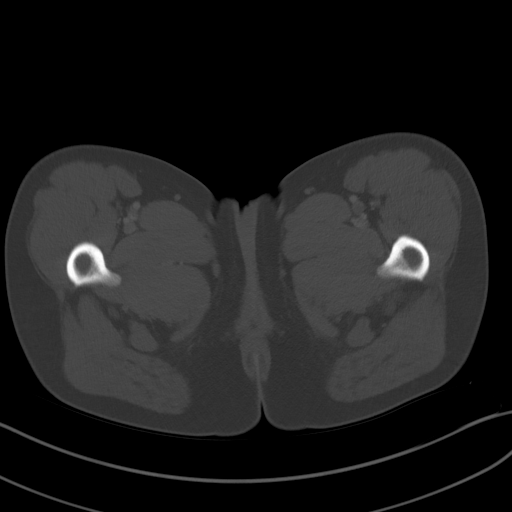
[im 12/96  soft-tissue]
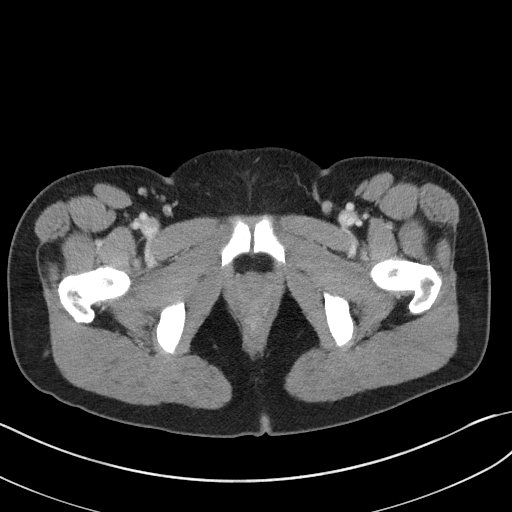
[im 20/96  soft-tissue]
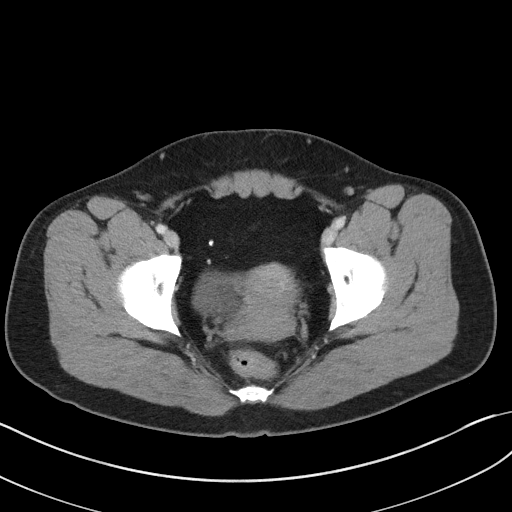
[im 28/96  soft-tissue]
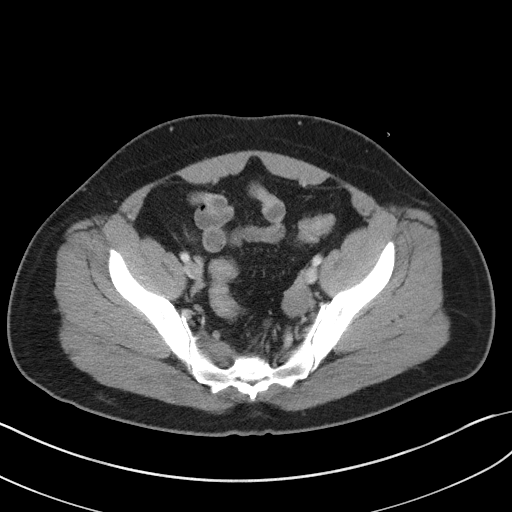
[im 36/96  soft-tissue]
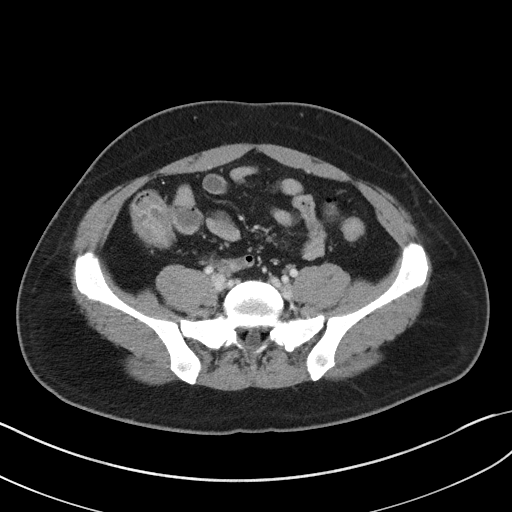
[im 44/96  soft-tissue]
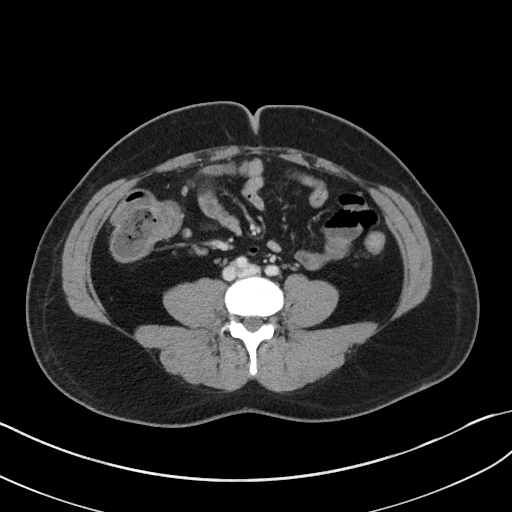
[im 52/96  soft-tissue]
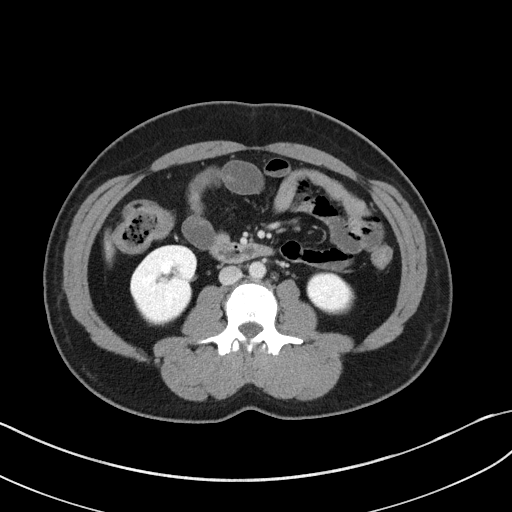
[im 60/96  soft-tissue]
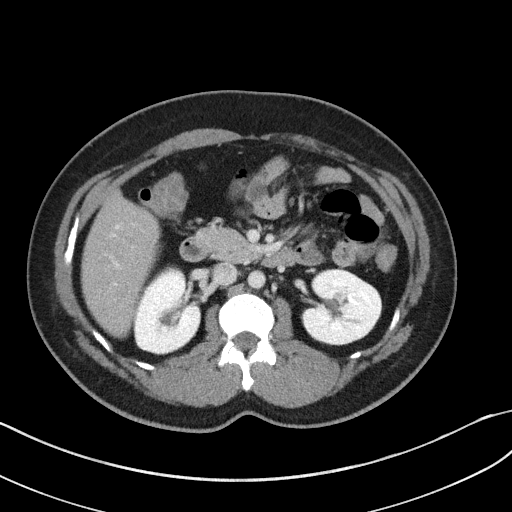
[im 68/96  soft-tissue]
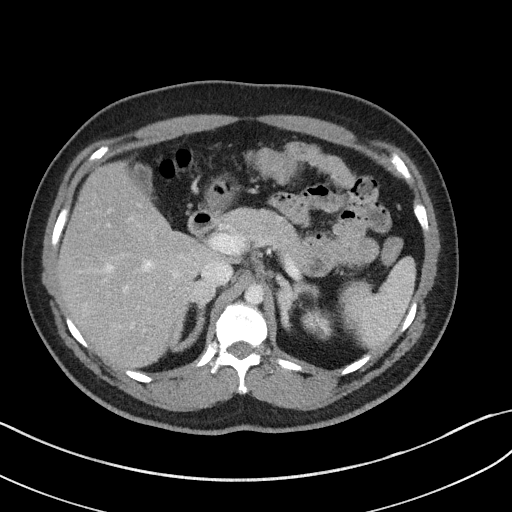
[im 68/96  bone]
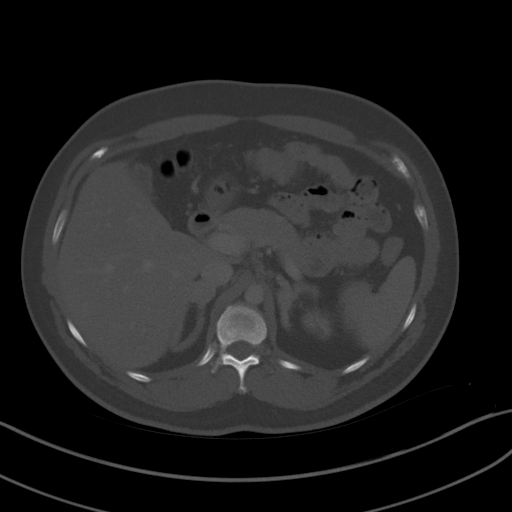
[im 76/96  soft-tissue]
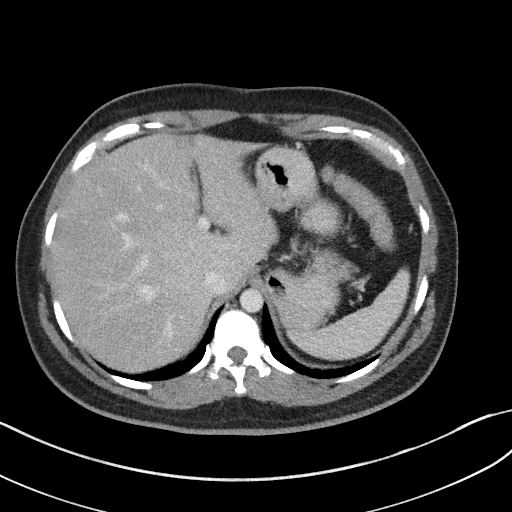
[im 84/96  soft-tissue]
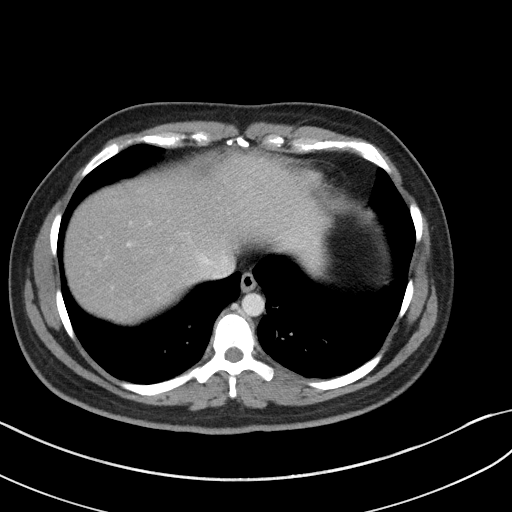
[im 92/96  soft-tissue]
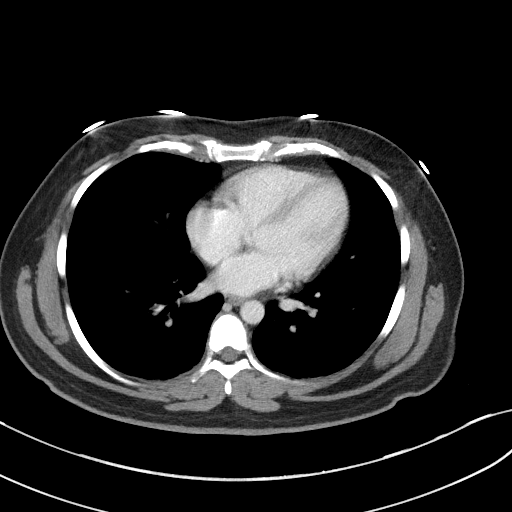

[Series 5: coronal st · coronal · 0.71mm/px · 3 of 88 slices shown]
[im 30/88  soft-tissue]
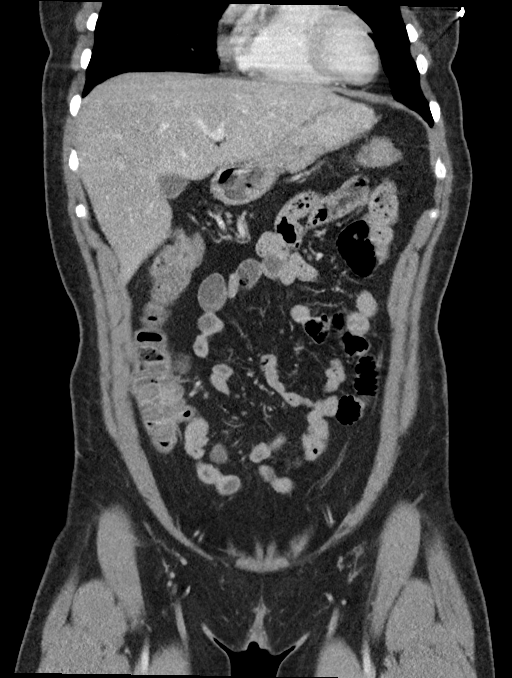
[im 39/88  soft-tissue]
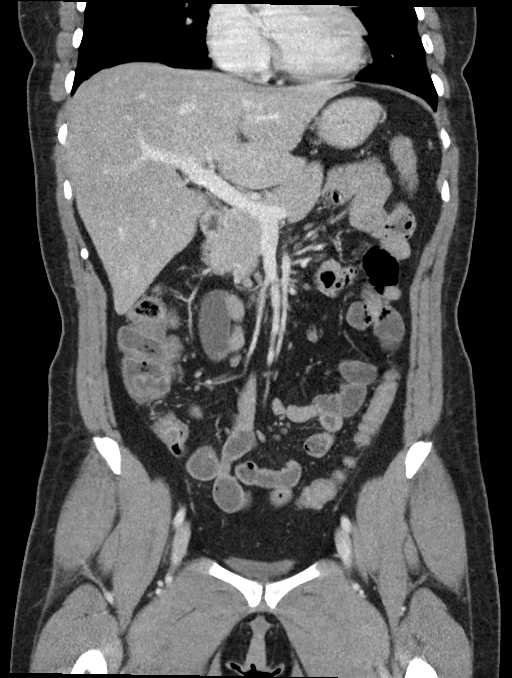
[im 49/88  soft-tissue]
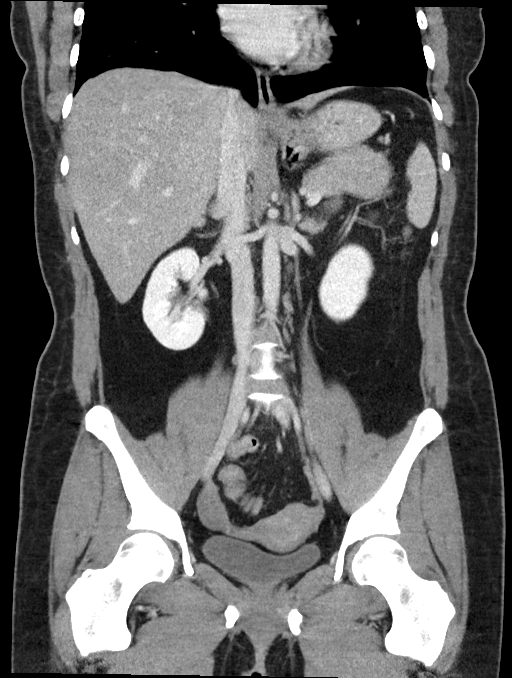

[15 of 46 positions shown; findings below may reference images not displayed]

RADIATION DOSE REDUCTION: This exam was performed according to the
departmental dose-optimization program which includes automated
exposure control, adjustment of the mA and/or kV according to
patient size and/or use of iterative reconstruction technique.

CONTRAST:  80mL OMNIPAQUE IOHEXOL 300 MG/ML  SOLN
FINDINGS: Lower chest: No acute abnormality.

Hepatobiliary: Gallbladder is unremarkable. Focal fatty deposition
adjacent to the falciform ligament. No intrahepatic or extrahepatic
biliary ductal dilation. Portal vein is patent.

Pancreas: Unremarkable. No pancreatic ductal dilatation or
surrounding inflammatory changes.

Spleen: Normal in size without focal abnormality.

Adrenals/Urinary Tract: There is a 20 mm indeterminate RIGHT adrenal
nodule. Mild nodularity of the LEFT adrenal gland. Kidneys enhance
symmetrically. No hydronephrosis. Subcentimeter hypodense lesions
are too small to accurately characterize. No obstructing
nephrolithiasis. At least partially duplicated RIGHT renal
collecting system. Bladder is unremarkable.

Stomach/Bowel: The appendix is distended with feces and air. Mucosa
is mildly hyperenhancing with mild adjacent fat stranding. It
measures approximately 10 mm. No evidence of bowel obstruction.

Vascular/Lymphatic: No significant vascular findings are present.
Mildly enlarged mesenteric lymph nodes in the RIGHT lower quadrant
with representative lymph node measuring 10 mm (series 2, image 51).

Reproductive: Uterus and bilateral adnexa are unremarkable.

Other: No free air or free fluid.

Musculoskeletal: No acute or significant osseous findings.
IMPRESSION: 1. Constellation of findings are favored to reflect early acute
appendicitis with reactive mesenteric lymph node enlargement.
2. There is an indeterminate RIGHT adrenal nodule. Recommend
nonemergent evaluation with dedicated adrenal protocol CT or MRI for
further characterization.
